# Patient Record
Sex: Male | Born: 1970 | Race: Black or African American | Hispanic: No | Marital: Married | Smoking: Never smoker
Health system: Southern US, Community
[De-identification: ages and names within clinical notes are randomized; demographics above are authoritative.]

## PROBLEM LIST (undated history)

## (undated) DIAGNOSIS — I10 Essential (primary) hypertension: Secondary | ICD-10-CM

## (undated) HISTORY — PX: NO PAST SURGERIES: SHX2092

---

## 2012-12-14 DIAGNOSIS — I10 Essential (primary) hypertension: Secondary | ICD-10-CM | POA: Insufficient documentation

## 2012-12-14 DIAGNOSIS — E785 Hyperlipidemia, unspecified: Secondary | ICD-10-CM | POA: Insufficient documentation

## 2013-12-05 DIAGNOSIS — Z6841 Body Mass Index (BMI) 40.0 and over, adult: Secondary | ICD-10-CM

## 2015-03-29 DIAGNOSIS — R7303 Prediabetes: Secondary | ICD-10-CM | POA: Insufficient documentation

## 2016-05-29 ENCOUNTER — Emergency Department
Admission: EM | Admit: 2016-05-29 | Discharge: 2016-05-29 | Disposition: A | Discharge status: Home / Self Care | Payer: BC Managed Care – PPO | Attending: Emergency Medicine | Admitting: Emergency Medicine

## 2016-05-29 ENCOUNTER — Encounter: Payer: Self-pay | Admitting: Urgent Care

## 2016-05-29 ENCOUNTER — Emergency Department: Payer: BC Managed Care – PPO

## 2016-05-29 ENCOUNTER — Emergency Department
Admission: EM | Admit: 2016-05-29 | Discharge: 2016-05-29 | Disposition: A | Discharge status: Home / Self Care | Payer: BC Managed Care – PPO | Source: Home / Self Care | Attending: Student | Admitting: Student

## 2016-05-29 ENCOUNTER — Encounter: Payer: Self-pay | Admitting: Emergency Medicine

## 2016-05-29 DIAGNOSIS — I10 Essential (primary) hypertension: Secondary | ICD-10-CM | POA: Insufficient documentation

## 2016-05-29 DIAGNOSIS — K8020 Calculus of gallbladder without cholecystitis without obstruction: Secondary | ICD-10-CM | POA: Diagnosis not present

## 2016-05-29 DIAGNOSIS — Z79899 Other long term (current) drug therapy: Secondary | ICD-10-CM | POA: Insufficient documentation

## 2016-05-29 DIAGNOSIS — K8050 Calculus of bile duct without cholangitis or cholecystitis without obstruction: Secondary | ICD-10-CM | POA: Insufficient documentation

## 2016-05-29 DIAGNOSIS — R109 Unspecified abdominal pain: Secondary | ICD-10-CM

## 2016-05-29 DIAGNOSIS — R1011 Right upper quadrant pain: Secondary | ICD-10-CM | POA: Diagnosis present

## 2016-05-29 HISTORY — DX: Essential (primary) hypertension: I10

## 2016-05-29 LAB — COMPREHENSIVE METABOLIC PANEL
ALBUMIN: 4.1 g/dL (ref 3.5–5.0)
ALT: 25 U/L (ref 17–63)
AST: 26 U/L (ref 15–41)
Alkaline Phosphatase: 49 U/L (ref 38–126)
Anion gap: 9 (ref 5–15)
BILIRUBIN TOTAL: 0.3 mg/dL (ref 0.3–1.2)
BUN: 17 mg/dL (ref 6–20)
CHLORIDE: 98 mmol/L — AB (ref 101–111)
CO2: 27 mmol/L (ref 22–32)
CREATININE: 1.09 mg/dL (ref 0.61–1.24)
Calcium: 9.1 mg/dL (ref 8.9–10.3)
GFR calc Af Amer: >60 mL/min (ref >60)
GLUCOSE: 115 mg/dL — AB (ref 65–99)
POTASSIUM: 3.4 mmol/L — AB (ref 3.5–5.1)
Sodium: 134 mmol/L — ABNORMAL LOW (ref 135–145)
TOTAL PROTEIN: 8.2 g/dL — AB (ref 6.5–8.1)

## 2016-05-29 LAB — CBC
HEMATOCRIT: 40.8 % (ref 40.0–52.0)
Hemoglobin: 13.7 g/dL (ref 13.0–18.0)
MCH: 26.3 pg (ref 26.0–34.0)
MCHC: 33.5 g/dL (ref 32.0–36.0)
MCV: 78.4 fL — AB (ref 80.0–100.0)
PLATELETS: 220 10*3/uL (ref 150–440)
RBC: 5.2 MIL/uL (ref 4.40–5.90)
RDW: 14.5 % (ref 11.5–14.5)
WBC: 5.2 10*3/uL (ref 3.8–10.6)

## 2016-05-29 LAB — LIPASE, BLOOD: Lipase: 22 U/L (ref 11–51)

## 2016-05-29 MED ORDER — OXYCODONE-ACETAMINOPHEN 5-325 MG PO TABS: 1.0000 | ORAL_TABLET | ORAL | 0 refills | Status: DC | PRN

## 2016-05-29 MED ORDER — ONDANSETRON 4 MG PO TBDP
4.0000 mg | ORAL_TABLET | Freq: Once | ORAL | Status: AC
  Administered 2016-05-29: 4 mg via ORAL
  Filled 2016-05-29: qty 1

## 2016-05-29 MED ORDER — ONDANSETRON HCL 4 MG/2ML IJ SOLN
4.0000 mg | Freq: Once | INTRAMUSCULAR | Status: AC
  Administered 2016-05-29: 4 mg via INTRAVENOUS
  Filled 2016-05-29: qty 2

## 2016-05-29 MED ORDER — OXYCODONE HCL 5 MG PO TABS: 5.0000 mg | ORAL_TABLET | Freq: Four times a day (QID) | ORAL | 0 refills | Status: DC | PRN

## 2016-05-29 MED ORDER — ONDANSETRON 4 MG PO TBDP: 4.0000 mg | ORAL_TABLET | Freq: Three times a day (TID) | ORAL | 0 refills | Status: DC | PRN

## 2016-05-29 MED ORDER — HYDROMORPHONE HCL 1 MG/ML IJ SOLN
1.0000 mg | Freq: Once | INTRAMUSCULAR | Status: AC
  Administered 2016-05-29: 1 mg via INTRAVENOUS
  Filled 2016-05-29: qty 1

## 2016-05-29 MED ORDER — HYDROMORPHONE HCL 1 MG/ML IJ SOLN
1.0000 mg | Freq: Once | INTRAMUSCULAR | Status: AC
  Administered 2016-05-29: 1 mg via INTRAMUSCULAR
  Filled 2016-05-29: qty 1

## 2016-05-29 MED ORDER — MORPHINE SULFATE (PF) 4 MG/ML IV SOLN
4.0000 mg | Freq: Once | INTRAVENOUS | Status: AC
  Administered 2016-05-29: 4 mg via INTRAVENOUS
  Filled 2016-05-29: qty 1

## 2016-05-29 MED ORDER — OXYCODONE-ACETAMINOPHEN 5-325 MG PO TABS
2.0000 | ORAL_TABLET | Freq: Once | ORAL | Status: AC
  Administered 2016-05-29: 2 via ORAL
  Filled 2016-05-29: qty 2

## 2016-05-29 NOTE — ED Triage Notes (Signed)
Pt seen and treated in ED last night. Pt dx with gallstones and states that the medication is not providing any relief.

## 2016-05-29 NOTE — ED Notes (Signed)
Pt discharged to home.  Family member driving.  Discharge instructions reviewed.  Verbalized understanding.  No questions or concerns at this time.  Teach back verified.  Pt in NAD.  No items left in ED.   

## 2016-05-29 NOTE — ED Provider Notes (Signed)
[facility] Emergency Department Provider Note   ____________________________________________   First MD Initiated Contact with Patient 05/29/16 1004     (approximate)  I have reviewed the triage vital signs and the nursing notes.   HISTORY  Chief Complaint Abdominal Pain    HPI Nathan DickerFreddy Burch is a 45 y.o. male with history of hypertension and gallstones who presents for evaluation of continued right upper quadrant abdominal pain since last night, gradual onset, constant, currently moderate to severe, mildly improved with Percocet. The patient was seen in this emergency department earlier this morning for similar pain, diagnosed with symptomatic cholelithiasis without cholecystitis by ultrasound. He reported his pain had improved and he was discharged at 7 AM however he took his Percocet and has had only minimal improvement of his symptoms so he returns for continued pain. He has not had any more vomiting. He has not had any fevers or chills. He denies any chest pain.   Past Medical History:  Diagnosis Date  . Hypertension     There are no active problems to display for this patient.   History reviewed. No pertinent surgical history.  Prior to Admission medications   Medication Sig Start Date End Date Taking? Authorizing Provider  Olmesartan-Amlodipine-HCTZ 40-10-25 MG TABS Take 1 tablet by mouth daily. 12/09/15   Historical Provider, MD  ondansetron (ZOFRAN ODT) 4 MG disintegrating tablet Take 1 tablet (4 mg total) by mouth every 8 (eight) hours as needed for nausea or vomiting. 05/29/16   Darci Currentandolph N Brown, MD  oxyCODONE-acetaminophen (ROXICET) 5-325 MG tablet Take 1 tablet by mouth every 4 (four) hours as needed for severe pain. 05/29/16   Darci Currentandolph N Brown, MD    Allergies Review of patient's allergies indicates no known allergies.  History reviewed. No pertinent family history.  Social History Social History  Substance Use Topics  . Smoking  status: Never Smoker  . Smokeless tobacco: Never Used  . Alcohol use No    Review of Systems Constitutional: No fever/chills Eyes: No visual changes. ENT: No sore throat. Cardiovascular: Denies chest pain. Respiratory: Denies shortness of breath. Gastrointestinal: + abdominal pain.  No nausea, no vomiting.  No diarrhea.  No constipation. Genitourinary: Negative for dysuria. Musculoskeletal: Negative for back pain. Skin: Negative for rash. Neurological: Negative for headaches, focal weakness or numbness.  10-point ROS otherwise negative.  ____________________________________________   PHYSICAL EXAM:  VITAL SIGNS: ED Triage Vitals  Enc Vitals Group     BP 05/29/16 1000 (!) 146/101     Pulse Rate 05/29/16 1000 88     Resp 05/29/16 1000 18     Temp 05/29/16 1000 97.7 F (36.5 C)     Temp Source 05/29/16 1000 Oral     SpO2 05/29/16 1000 98 %     Weight 05/29/16 1000 (!) 373 lb (169.2 kg)     Height 05/29/16 1000 5\' 11"  (1.803 m)     Head Circumference --      Peak Flow --      Pain Score 05/29/16 1001 10     Pain Loc --      Pain Edu? --      Excl. in GC? --     Constitutional: Alert and oriented. Well appearing and in no acute distress. Eyes: Conjunctivae are normal. PERRL. EOMI. Head: Atraumatic. Nose: No congestion/rhinnorhea. Mouth/Throat: Mucous membranes are moist.  Oropharynx non-erythematous. Neck: No stridor. Supple without meningismus. Cardiovascular: Normal rate, regular rhythm. Grossly normal heart sounds.  Good peripheral circulation. Respiratory:  Normal respiratory effort.  No retractions. Lungs CTAB. Gastrointestinal: Soft with tenderness to palpation in the epigastrium and the right upper quadrant. Obese body habitus limits the exam. No CVA tenderness. Genitourinary: Deferred Musculoskeletal: No lower extremity tenderness nor edema.  No joint effusions. Neurologic:  Normal speech and language. No gross focal neurologic deficits are appreciated. No  gait instability. Skin:  Skin is warm, dry and intact. No rash noted. Psychiatric: Mood and affect are normal. Speech and behavior are normal.  ____________________________________________   LABS (all labs ordered are listed, but only abnormal results are displayed)  Labs Reviewed - No data to display ____________________________________________  EKG  None ____________________________________________  RADIOLOGY  none ____________________________________________   PROCEDURES  Procedure(s) performed: None  Procedures  Critical Care performed: No  ____________________________________________   INITIAL IMPRESSION / ASSESSMENT AND PLAN / ED COURSE  Pertinent labs & imaging results that were available during my care of the patient were reviewed by me and considered in my medical decision making (see chart for details).  Nathan Burch is a 45 y.o. male with history of hypertension and gallstones who presents for evaluation of continued right upper quadrant abdominal pain since last night, in the setting of recently diagnosed symptomatic cholelithiasis. On exam, he is generally well-appearing and in no acute distress. His vital signs are stable and he is afebrile. He has mild epigastric and right upper quadrant tenderness to palpation. His labs were drawn at approximately 3:42 AM and they were unrevealing, in the absence of fever or vomiting, no indication for repeat lab work at this time. His ultrasound obtained this morning, no indication for repeat at this time. We'll treat him symptomatically and reassess for disposition.  ----------------------------------------- 11:50 AM on 05/29/2016 -----------------------------------------  Patient reports his pain is significantly improved at this time, he is tolerating by mouth intake without vomiting. Discussed discharge with return precautions, need for close general surgery follow-up and he is comfortable with the discharge plan. DC  home.  Clinical Course     ____________________________________________   FINAL CLINICAL IMPRESSION(S) / ED DIAGNOSES  Final diagnoses:  Abdominal pain, unspecified abdominal location  Biliary colic      NEW MEDICATIONS STARTED DURING THIS VISIT:  New Prescriptions   No medications on file     Note:  This document was prepared using Dragon voice recognition software and may include unintentional dictation errors.    Gayla Doss, MD 05/29/16 1151

## 2016-05-29 NOTE — ED Notes (Signed)
MD at bedside for update on discharge

## 2016-05-29 NOTE — ED Triage Notes (Signed)
Patient presents with c/o epigastric pain with (+) N/V that started around midnight. Patient has had issues in the past with his gallbladder; feels the same.

## 2016-05-29 NOTE — ED Provider Notes (Signed)
[facility] Emergency Department Provider Note   First MD Initiated Contact with Patient 05/29/16 [phone]     (approximate)  I have reviewed the triage vital signs and the nursing notes.   HISTORY  Chief Complaint Abdominal Pain; Nausea; and Emesis   HPI Nathan Burch is a 45 y.o. male with history of "gallbladder problems" presents to the emergency department with persistent right upper quadrant abdominal pain accompanied by vomiting with onset at midnight tonight. Patient states his current pain score is 10 out of 10. Patient denies any fever or diarrhea. Patient denies any urinary symptoms.   Past Medical History:  Diagnosis Date  . Hypertension     There are no active problems to display for this patient.   Past surgical history None  Prior to Admission medications   Medication Sig Start Date End Date Taking? Authorizing Provider  Olmesartan-Amlodipine-HCTZ 40-10-25 MG TABS Take 1 tablet by mouth daily. 12/09/15  Yes Historical Provider, MD    Allergies No known drug allergies No family history on file.  Social History Social History  Substance Use Topics  . Smoking status: Never Smoker  . Smokeless tobacco: Never Used  . Alcohol use No    Review of Systems Constitutional: No fever/chills Eyes: No visual changes. ENT: No sore throat. Cardiovascular: Denies chest pain. Respiratory: Denies shortness of breath. Gastrointestinal: Positive for abdominal pain and vomiting  No diarrhea.  No constipation. Genitourinary: Negative for dysuria. Musculoskeletal: Negative for back pain. Skin: Negative for rash. Neurological: Negative for headaches, focal weakness or numbness.  10-point ROS otherwise negative.  ____________________________________________   PHYSICAL EXAM:  VITAL SIGNS: ED Triage Vitals  Enc Vitals Group     BP 05/29/16 0308 139/84     Pulse Rate 05/29/16 0308 99     Resp 05/29/16 0308 20     Temp 05/29/16 0308 98.1 F  (36.7 C)     Temp Source 05/29/16 0308 Oral     SpO2 05/29/16 0308 95 %     Weight 05/29/16 0309 (!) 373 lb (169.2 kg)     Height 05/29/16 0309 5\' 11"  (1.803 m)     Head Circumference --      Peak Flow --      Pain Score 05/29/16 0317 7     Pain Loc --      Pain Edu? --      Excl. in GC? --     Constitutional: Alert and oriented. Apparent discomfort Eyes: Conjunctivae are normal. PERRL. EOMI. Head: Atraumatic. Mouth/Throat: Mucous membranes are moist.  Oropharynx non-erythematous. Neck: No stridor.  No meningeal signs.   Cardiovascular: Normal rate, regular rhythm. Good peripheral circulation. Grossly normal heart sounds. Respiratory: Normal respiratory effort.  No retractions. Lungs CTAB. Gastrointestinal: Right upper quadrant tenderness to palpation. No distention.  Musculoskeletal: No lower extremity tenderness nor edema. No gross deformities of extremities. Neurologic:  Normal speech and language. No gross focal neurologic deficits are appreciated.  Skin:  Skin is warm, dry and intact. No rash noted. Psychiatric: Mood and affect are normal. Speech and behavior are normal.  ____________________________________________   LABS (all labs ordered are listed, but only abnormal results are displayed)  Labs Reviewed  COMPREHENSIVE METABOLIC PANEL - Abnormal; Notable for the following:       Result Value   Sodium 134 (*)    Potassium 3.4 (*)    Chloride 98 (*)    Glucose, Bld 115 (*)    Total Protein 8.2 (*)  All other components within normal limits  CBC - Abnormal; Notable for the following:    MCV 78.4 (*)    All other components within normal limits  LIPASE, BLOOD  URINALYSIS COMPLETEWITH MICROSCOPIC ([facility] ONLY)   ____________________________________  RADIOLOGY I, [place] Dewayne Shorter, personally viewed and evaluated these images (plain radiographs) as part of my medical decision making, as well as reviewing the written report by the radiologist.*  US Abdomen Limited  Ruq  Result Date: 05/29/2016 CLINICAL DATA:  Right upper quadrant epigastric pain with nausea and vomiting for 4 hours. EXAM: US ABDOMEN LIMITED - RIGHT UPPER QUADRANT COMPARISON:  None. FINDINGS: Gallbladder: Cholelithiasis with several stones in the gallbladder including a nonmobile stone in the gallbladder neck. Largest stone is in the fundus and measures 2.5 cm diameter. No gallbladder wall thickening or edema. Murphy's sign is negative. Common bile duct: Diameter: 3 mm, normal Liver: Limited visualization due to body habitus and rib shadows. Appears to be diffuse increase in the liver echotexture consistent with fatty infiltration. No focal lesions identified. IMPRESSION: Cholelithiasis with a stone in the gallbladder neck. No additional features to suggest cholecystitis. Diffuse fatty infiltration of the liver. Electronically Signed   By: Burman Nieves M.D.   On: 05/29/2016 04:40      Procedures      INITIAL IMPRESSION / ASSESSMENT AND PLAN / ED COURSE  Pertinent labs & imaging results that were available during my care of the patient were reviewed by me and considered in my medical decision making (see chart for details).  Patient discussed with Dr. Evette Cristal general surgeon on call who recommended outpatient follow-up with pain control was achieved. Patient reevaluated pain is improved and a such patient will be discharged home with referral to Dr.   Clinical Course    ____________________________________________  FINAL CLINICAL IMPRESSION(S) / ED DIAGNOSES  Final diagnoses:  RUQ pain  Gallstones     MEDICATIONS GIVEN DURING THIS VISIT:  Medications  morphine 4 MG/ML injection 4 mg (4 mg Intravenous Given 05/29/16 0342)  ondansetron (ZOFRAN) injection 4 mg (4 mg Intravenous Given 05/29/16 0342)  HYDROmorphone (DILAUDID) injection 1 mg (1 mg Intravenous Given 05/29/16 0439)     NEW OUTPATIENT MEDICATIONS STARTED DURING THIS VISIT:  New Prescriptions   No medications  on file    Modified Medications   No medications on file    Discontinued Medications   No medications on file     Note:  This document was prepared using Dragon voice recognition software and may include unintentional dictation errors.    Darci Current, MD 05/29/16 [phone]

## 2016-05-30 ENCOUNTER — Other Ambulatory Visit: Payer: Self-pay

## 2016-05-31 ENCOUNTER — Ambulatory Visit (INDEPENDENT_AMBULATORY_CARE_PROVIDER_SITE_OTHER): Payer: BC Managed Care – PPO | Admitting: General Surgery

## 2016-05-31 ENCOUNTER — Encounter: Payer: Self-pay | Admitting: General Surgery

## 2016-05-31 VITALS — BP 125/81 | HR 88 | Temp 101.6°F | Ht 71.0 in | Wt 370.0 lb

## 2016-05-31 DIAGNOSIS — K8050 Calculus of bile duct without cholangitis or cholecystitis without obstruction: Secondary | ICD-10-CM

## 2016-05-31 NOTE — Progress Notes (Signed)
Patient ID: Nathan DickerFreddy Buechler, male   DOB: November 09, 1970, 45 y.o.   MRN: [id number]  CC: ABDOMINAL PAIN  HPI Nathan Burch is a 45 y.o. male presents to clinic for evaluation of abdominal pain. Patient reports using the emergency department 2 days ago for this abdominal pain. He had similar abdominal pain 5 years ago but states that pain was much worse than this attack. While he was being evaluated in the emergency department the pain resolved with pain medications. He has not had severe pain since leaving the emergency department. He has not been checking his temperature at home but he has had subjective chills. He denies any fevers, chest pain, shortness of breath, nausea, vomiting, diarrhea, constipation. He has been on a new diet which has changed his intake patterns within the last week he is otherwise in his usual state of health.  HPI  Past Medical History:  Diagnosis Date  . Hypertension     Past Surgical History:  Procedure Laterality Date  . NO PAST SURGERIES      Family History  Problem Relation Age of Onset  . Heart disease Mother   . Diabetes Mother   . Hypertension Mother   . Heart disease Father   . Hypertension Father     Social History Social History  Substance Use Topics  . Smoking status: Never Smoker  . Smokeless tobacco: Never Used  . Alcohol use No    No Known Allergies  Current Outpatient Prescriptions  Medication Sig Dispense Refill  . atorvastatin (LIPITOR) 20 MG tablet Take by mouth.    . Olmesartan-Amlodipine-HCTZ 40-10-25 MG TABS Take 1 tablet by mouth daily.    . ondansetron (ZOFRAN ODT) 4 MG disintegrating tablet Take 1 tablet (4 mg total) by mouth every 8 (eight) hours as needed for nausea or vomiting. 12 tablet 0  . oxyCODONE (ROXICODONE) 5 MG immediate release tablet Take 1 tablet (5 mg total) by mouth every 6 (six) hours as needed for moderate pain (Take 1-2 tabs by mouth every 6 hours when necessary for pain). Do not drive while taking this  medication. 15 tablet 0   No current facility-administered medications for this visit.      Review of Systems A Multi-point review of systems was asked and was negative except for the findings documented in the history of present illness  Physical Exam Blood pressure 125/81, pulse 88, temperature (!) 101.6 F (38.7 C), temperature source Oral, height 5\' 11"  (1.803 m), weight (!) 167.8 kg (370 lb). CONSTITUTIONAL: No acute distress. EYES: Pupils are equal, round, and reactive to light, Sclera are non-icteric. EARS, NOSE, MOUTH AND THROAT: The oropharynx is clear. The oral mucosa is pink and moist. Hearing is intact to voice. LYMPH NODES:  Lymph nodes in the neck are normal. RESPIRATORY:  Lungs are clear. There is normal respiratory effort, with equal breath sounds bilaterally, and without pathologic use of accessory muscles. CARDIOVASCULAR: Heart is regular without murmurs, gallops, or rubs. GI: The abdomen is very large, soft, nontender, and nondistended. There are no palpable masses. There is no hepatosplenomegaly. There are normal bowel sounds in all quadrants. GU: Rectal deferred.   MUSCULOSKELETAL: Normal muscle strength and tone. No cyanosis or edema.   SKIN: Turgor is good and there are no pathologic skin lesions or ulcers. NEUROLOGIC: Motor and sensation is grossly normal. Cranial nerves are grossly intact. PSYCH:  Oriented to person, place and time. Affect is normal.  Data Reviewed Images and labs reviewed from the ER. Labs are all  within normal limits without an elevated white blood cell count. Ultrasound reviewed which shows evidence of cholelithiasis but no gallbladder wall thickening, pericholecystic fluid, common duct dilatation I have personally reviewed the patient's imaging, laboratory findings and medical records.    Assessment    Biliary colic    Plan    45 year old male with biliary colic. Discussed at length the signs and symptoms of cholecystitis. Provided him  with the treatment options would include surgery was a laparoscopic cholecystectomy. The procedure itself was described in detail. A long discussion about the disease process and treatment options patient voiced he was unsure whether not he wants surgery. He would like to take some time to think about it and he will contact clinic should he decide to proceed with surgical intervention. Discussed again the signs of cholecystitis and for him to return immediately to either clinic or the emergency department should they occur. He voiced understanding and agreement.     Time spent with the patient was 45 minutes, with more than 50% of the time spent in face-to-face education, counseling and care coordination.     Ricarda Frame, MD FACS General Surgeon 05/31/2016, 3:32 PM

## 2016-05-31 NOTE — Patient Instructions (Signed)
Please call our office with any questions or concerns.  Low-Fat Diet for Pancreatitis or Gallbladder Conditions A low-fat diet can be helpful if you have pancreatitis or a gallbladder condition. With these conditions, your pancreas and gallbladder have trouble digesting fats. A healthy eating plan with less fat will help rest your pancreas and gallbladder and reduce your symptoms. WHAT DO I NEED TO KNOW ABOUT THIS DIET?  Eat a low-fat diet.  Reduce your fat intake to less than 20-30% of your total daily calories. This is less than 50-60 g of fat per day.  Remember that you need some fat in your diet. Ask your dietician what your daily goal should be.  Choose nonfat and low-fat healthy foods. Look for the words "nonfat," "low fat," or "fat free."  As a guide, look on the label and choose foods with less than 3 g of fat per serving. Eat only one serving.  Avoid alcohol.  Do not smoke. If you need help quitting, talk with your health care provider.  Eat small frequent meals instead of three large heavy meals. WHAT FOODS CAN I EAT? Grains Include healthy grains and starches such as potatoes, wheat bread, fiber-rich cereal, and brown rice. Choose whole grain options whenever possible. In adults, whole grains should account for 45-65% of your daily calories.  Fruits and Vegetables Eat plenty of fruits and vegetables. Fresh fruits and vegetables add fiber to your diet. Meats and Other Protein Sources Eat lean meat such as chicken and pork. Trim any fat off of meat before cooking it. Eggs, fish, and beans are other sources of protein. In adults, these foods should account for 10-35% of your daily calories. Dairy Choose low-fat milk and dairy options. Dairy includes fat and protein, as well as calcium.  Fats and Oils Limit high-fat foods such as fried foods, sweets, baked goods, sugary drinks.  Other Creamy sauces and condiments, such as mayonnaise, can add extra fat. Think about whether or  not you need to use them, or use smaller amounts or low fat options. WHAT FOODS ARE NOT RECOMMENDED?  High fat foods, such as:  [employer]aked goods.  Ice cream.  [place] toast.  Sweet rolls.  Pizza.  Cheese bread.  Foods covered with batter, butter, creamy sauces, or cheese.  Fried foods.  Sugary drinks and desserts.  Foods that cause gas or bloating   This information is not intended to replace advice given to you by your health care provider. Make sure you discuss any questions you have with your health care provider.   Document Released: 08/27/2013 Document Reviewed: 08/27/2013 Elsevier Interactive Patient Education [employer]2016 Elsevier Inc.

## 2016-06-01 ENCOUNTER — Telehealth: Payer: Self-pay | Admitting: General Surgery

## 2016-06-01 ENCOUNTER — Observation Stay
Admission: EM | Admit: 2016-06-01 | Discharge: 2016-06-05 | Disposition: A | Discharge status: Home / Self Care | Payer: BC Managed Care – PPO | Attending: Surgery | Admitting: Surgery

## 2016-06-01 ENCOUNTER — Emergency Department: Payer: BC Managed Care – PPO

## 2016-06-01 ENCOUNTER — Encounter: Payer: Self-pay | Admitting: Emergency Medicine

## 2016-06-01 DIAGNOSIS — Z6841 Body Mass Index (BMI) 40.0 and over, adult: Secondary | ICD-10-CM | POA: Diagnosis not present

## 2016-06-01 DIAGNOSIS — I10 Essential (primary) hypertension: Secondary | ICD-10-CM | POA: Diagnosis not present

## 2016-06-01 DIAGNOSIS — K810 Acute cholecystitis: Secondary | ICD-10-CM | POA: Diagnosis not present

## 2016-06-01 DIAGNOSIS — K8000 Calculus of gallbladder with acute cholecystitis without obstruction: Secondary | ICD-10-CM | POA: Diagnosis not present

## 2016-06-01 DIAGNOSIS — R52 Pain, unspecified: Secondary | ICD-10-CM

## 2016-06-01 DIAGNOSIS — E785 Hyperlipidemia, unspecified: Secondary | ICD-10-CM | POA: Diagnosis not present

## 2016-06-01 DIAGNOSIS — Z79899 Other long term (current) drug therapy: Secondary | ICD-10-CM | POA: Insufficient documentation

## 2016-06-01 DIAGNOSIS — R109 Unspecified abdominal pain: Secondary | ICD-10-CM | POA: Diagnosis present

## 2016-06-01 LAB — COMPREHENSIVE METABOLIC PANEL
ALT: 30 U/L (ref 17–63)
ANION GAP: 10 (ref 5–15)
AST: 24 U/L (ref 15–41)
Albumin: 3.6 g/dL (ref 3.5–5.0)
Alkaline Phosphatase: 50 U/L (ref 38–126)
BILIRUBIN TOTAL: 0.9 mg/dL (ref 0.3–1.2)
BUN: 12 mg/dL (ref 6–20)
CHLORIDE: 97 mmol/L — AB (ref 101–111)
CO2: 27 mmol/L (ref 22–32)
Calcium: 9.1 mg/dL (ref 8.9–10.3)
Creatinine, Ser: 1.06 mg/dL (ref 0.61–1.24)
Glucose, Bld: 100 mg/dL — ABNORMAL HIGH (ref 65–99)
POTASSIUM: 3.5 mmol/L (ref 3.5–5.1)
Sodium: 134 mmol/L — ABNORMAL LOW (ref 135–145)
TOTAL PROTEIN: 7.5 g/dL (ref 6.5–8.1)

## 2016-06-01 LAB — URINALYSIS COMPLETE WITH MICROSCOPIC (ARMC ONLY)
BACTERIA UA: NONE SEEN
BILIRUBIN URINE: NEGATIVE
Glucose, UA: NEGATIVE mg/dL
Hgb urine dipstick: NEGATIVE
KETONES UR: NEGATIVE mg/dL
LEUKOCYTES UA: NEGATIVE
NITRITE: NEGATIVE
PH: 7 (ref 5.0–8.0)
Protein, ur: NEGATIVE mg/dL
Specific Gravity, Urine: 1.013 (ref 1.005–1.030)

## 2016-06-01 LAB — CBC
HEMATOCRIT: 39.3 % — AB (ref 40.0–52.0)
HEMOGLOBIN: 13.3 g/dL (ref 13.0–18.0)
MCH: 26.5 pg (ref 26.0–34.0)
MCHC: 33.9 g/dL (ref 32.0–36.0)
MCV: 78 fL — AB (ref 80.0–100.0)
Platelets: 229 10*3/uL (ref 150–440)
RBC: 5.05 MIL/uL (ref 4.40–5.90)
RDW: 14.7 % — AB (ref 11.5–14.5)
WBC: 7.7 10*3/uL (ref 3.8–10.6)

## 2016-06-01 LAB — LIPASE, BLOOD: LIPASE: 22 U/L (ref 11–51)

## 2016-06-01 MED ORDER — AMLODIPINE BESYLATE 10 MG PO TABS
10.0000 mg | ORAL_TABLET | Freq: Every day | ORAL | Status: DC
  Administered 2016-06-02: 10 mg via ORAL
  Filled 2016-06-01: qty 1

## 2016-06-01 MED ORDER — SODIUM CHLORIDE 0.9 % IV SOLN
3.0000 g | Freq: Four times a day (QID) | INTRAVENOUS | Status: DC
  Administered 2016-06-01 – 2016-06-05 (×14): 3 g via INTRAVENOUS
  Filled 2016-06-01 (×18): qty 3

## 2016-06-01 MED ORDER — PIPERACILLIN-TAZOBACTAM 3.375 G IVPB 30 MIN
3.3750 g | Freq: Once | INTRAVENOUS | Status: AC
  Administered 2016-06-01: 3.375 g via INTRAVENOUS
  Filled 2016-06-01: qty 50

## 2016-06-01 MED ORDER — OXYCODONE HCL 5 MG PO TABS
5.0000 mg | ORAL_TABLET | Freq: Four times a day (QID) | ORAL | Status: DC | PRN
  Administered 2016-06-01: 10 mg via ORAL
  Administered 2016-06-02: 5 mg via ORAL
  Filled 2016-06-01 (×3): qty 1

## 2016-06-01 MED ORDER — ONDANSETRON HCL 4 MG PO TABS: 4.0000 mg | ORAL_TABLET | Freq: Four times a day (QID) | ORAL | Status: DC | PRN

## 2016-06-01 MED ORDER — SODIUM CHLORIDE 0.9 % IV SOLN
INTRAVENOUS | Status: AC
  Administered 2016-06-01: 3 g via INTRAVENOUS
  Filled 2016-06-01: qty 3

## 2016-06-01 MED ORDER — ONDANSETRON HCL 4 MG/2ML IJ SOLN
4.0000 mg | Freq: Four times a day (QID) | INTRAMUSCULAR | Status: DC | PRN
Start: 2016-06-01 — End: 2016-06-05

## 2016-06-01 MED ORDER — IRBESARTAN 300 MG PO TABS
300.0000 mg | ORAL_TABLET | Freq: Every day | ORAL | Status: DC
  Administered 2016-06-02 – 2016-06-05 (×4): 300 mg via ORAL
  Filled 2016-06-01 (×4): qty 1

## 2016-06-01 MED ORDER — OLMESARTAN-AMLODIPINE-HCTZ 40-10-25 MG PO TABS: 1.0000 | ORAL_TABLET | ORAL | Status: DC

## 2016-06-01 MED ORDER — HYDROCHLOROTHIAZIDE 25 MG PO TABS
25.0000 mg | ORAL_TABLET | Freq: Every day | ORAL | Status: DC
  Administered 2016-06-02 – 2016-06-05 (×4): 25 mg via ORAL
  Filled 2016-06-01 (×4): qty 1

## 2016-06-01 MED ORDER — MORPHINE SULFATE (PF) 4 MG/ML IV SOLN
4.0000 mg | INTRAVENOUS | Status: DC | PRN
  Administered 2016-06-01 – 2016-06-03 (×3): 4 mg via INTRAVENOUS
  Filled 2016-06-01 (×3): qty 1

## 2016-06-01 MED ORDER — LACTATED RINGERS IV SOLN
INTRAVENOUS | Status: DC
  Administered 2016-06-01 – 2016-06-03 (×4): via INTRAVENOUS

## 2016-06-01 MED ORDER — ONDANSETRON HCL 4 MG/2ML IJ SOLN
4.0000 mg | Freq: Once | INTRAMUSCULAR | Status: AC
  Administered 2016-06-01: 4 mg via INTRAVENOUS
  Filled 2016-06-01: qty 2

## 2016-06-01 MED ORDER — HEPARIN SODIUM (PORCINE) 5000 UNIT/ML IJ SOLN
5000.0000 [IU] | Freq: Three times a day (TID) | INTRAMUSCULAR | Status: DC
  Administered 2016-06-01 – 2016-06-02 (×2): 5000 [IU] via SUBCUTANEOUS
  Filled 2016-06-01 (×2): qty 1

## 2016-06-01 NOTE — ED Triage Notes (Signed)
Pt started with RUQ pain Sunday. Dx with gallstones and saw surgery yesterday and will get scheduled for surgery but today pain is worse and dr Tonita Congwoodham office told pt to come to ED. Fever yesterday 101 at surgery office.

## 2016-06-01 NOTE — Telephone Encounter (Signed)
Patient was in yesterday. He is almost out of Oxycoden 5mg  and needs a refill. Ondansetronodt 4 mg for nausea, he may need more of that. He stated that Dr. Tonita CongWoodham told him to think about surgery and call when ready. [facility] [address]

## 2016-06-01 NOTE — ED Notes (Signed)
Patient updated on wait time 

## 2016-06-01 NOTE — ED Notes (Signed)
Patient transported to Ultrasound 

## 2016-06-01 NOTE — ED Provider Notes (Signed)
2  [facility] Emergency Department Provider Note    First MD Initiated Contact with Patient 06/01/16 1620     (approximate)  I have reviewed the triage vital signs and the nursing notes.   HISTORY  Chief Complaint Abdominal Pain    HPI Nathan Burch is a 45 y.o. male who recently diagnosed cholelithiasis presents with persistent and worsening right upper quadrant pain associated with nausea. She currently states it is 5 out of 10 in severity but states that during episodes will go up to 10 out of 10. Patient went to see general surgery yesterday with plan for outpatient cholecystectomy. Patient was noted to be febrile in the office. States he has been having ongoing chills. He was having worsening pain today and called the clinic who directed him to the ER for further evaluation. States that the pain is not changed in character. Denies shortness of breath or cough. No congestion.   Past Medical History:  Diagnosis Date  . Hypertension     Patient Active Problem List   Diagnosis Date Noted  . Prediabetes 03/29/2015  . Morbid obesity with BMI of 50.0-59.9, adult (HCC) 12/05/2013  . Idiopathic hypertension 12/14/2012  . Hyperlipidemia 12/14/2012    Past Surgical History:  Procedure Laterality Date  . NO PAST SURGERIES      Prior to Admission medications   Medication Sig Start Date End Date Taking? Authorizing Provider  atorvastatin (LIPITOR) 20 MG tablet Take by mouth. 11/11/15   Historical Provider, MD  Olmesartan-Amlodipine-HCTZ 40-10-25 MG TABS Take 1 tablet by mouth daily. 12/09/15   Historical Provider, MD  ondansetron (ZOFRAN ODT) 4 MG disintegrating tablet Take 1 tablet (4 mg total) by mouth every 8 (eight) hours as needed for nausea or vomiting. 05/29/16   Gayla DossEryka A Gayle, MD  oxyCODONE (ROXICODONE) 5 MG immediate release tablet Take 1 tablet (5 mg total) by mouth every 6 (six) hours as needed for moderate pain (Take 1-2 tabs by mouth every 6 hours  when necessary for pain). Do not drive while taking this medication. 05/29/16   Gayla DossEryka A Gayle, MD    Allergies Review of patient's allergies indicates no known allergies.  Family History  Problem Relation Age of Onset  . Heart disease Mother   . Diabetes Mother   . Hypertension Mother   . Heart disease Father   . Hypertension Father     Social History Social History  Substance Use Topics  . Smoking status: Never Smoker  . Smokeless tobacco: Never Used  . Alcohol use No    Review of Systems Patient denies headaches, rhinorrhea, blurry vision, numbness, shortness of breath, chest pain, edema, cough, abdominal pain, nausea, vomiting, diarrhea, dysuria, fevers, rashes or hallucinations unless otherwise stated above in HPI. ____________________________________________   PHYSICAL EXAM:  VITAL SIGNS: Vitals:   06/01/16 1302  BP: 139/90  Pulse: 93  Resp: 18  Temp: 99 F (37.2 C)    Constitutional: Alert and oriented. Well appearing and in no acute distress. Eyes: Conjunctivae are normal. PERRL. EOMI. Head: Atraumatic. Nose: No congestion/rhinnorhea. Mouth/Throat: Mucous membranes are moist.  Oropharynx non-erythematous. Neck: No stridor. Painless ROM. No cervical spine tenderness to palpation Hematological/Lymphatic/Immunilogical: No cervical lymphadenopathy. Cardiovascular: Normal rate, regular rhythm. Grossly normal heart sounds.  Good peripheral circulation. Respiratory: Normal respiratory effort.  No retractions. Lungs CTAB. Gastrointestinal: Soft and RUQ ttp. No distention. No abdominal bruits. No CVA tenderness. Musculoskeletal: No lower extremity tenderness nor edema.  No joint effusions. Neurologic:  Normal speech and  language. No gross focal neurologic deficits are appreciated. No gait instability. Skin:  Skin is warm, dry and intact. No rash noted. Psychiatric: Mood and affect are normal. Speech and behavior are  normal.  ____________________________________________   LABS (all labs ordered are listed, but only abnormal results are displayed)  Results for orders placed or performed during the hospital encounter of 06/01/16 (from the past 24 hour(s))  Lipase, blood     Status: None   Collection Time: 06/01/16  1:04 PM  Result Value Ref Range   Lipase 22 11 - 51 U/L  Comprehensive metabolic panel     Status: Abnormal   Collection Time: 06/01/16  1:04 PM  Result Value Ref Range   Sodium 134 (L) 135 - 145 mmol/L   Potassium 3.5 3.5 - 5.1 mmol/L   Chloride 97 (L) 101 - 111 mmol/L   CO2 27 22 - 32 mmol/L   Glucose, Bld 100 (H) 65 - 99 mg/dL   BUN 12 6 - 20 mg/dL   Creatinine, Ser 1.61 0.61 - 1.24 mg/dL   Calcium 9.1 8.9 - 09.6 mg/dL   Total Protein 7.5 6.5 - 8.1 g/dL   Albumin 3.6 3.5 - 5.0 g/dL   AST 24 15 - 41 U/L   ALT 30 17 - 63 U/L   Alkaline Phosphatase 50 38 - 126 U/L   Total Bilirubin 0.9 0.3 - 1.2 mg/dL   GFR calc non Af Amer >60 >60 mL/min   GFR calc Af Amer >60 >60 mL/min   Anion gap 10 5 - 15  CBC     Status: Abnormal   Collection Time: 06/01/16  1:04 PM  Result Value Ref Range   WBC 7.7 3.8 - 10.6 K/uL   RBC 5.05 4.40 - 5.90 MIL/uL   Hemoglobin 13.3 13.0 - 18.0 g/dL   HCT 04.5 (L) 40.9 - 81.1 %   MCV 78.0 (L) 80.0 - 100.0 fL   MCH 26.5 26.0 - 34.0 pg   MCHC 33.9 32.0 - 36.0 g/dL   RDW 91.4 (H) 78.2 - 95.6 %   Platelets 229 150 - 440 K/uL  Urinalysis complete, with microscopic     Status: Abnormal   Collection Time: 06/01/16  1:05 PM  Result Value Ref Range   Color, Urine YELLOW (A) YELLOW   APPearance CLEAR (A) CLEAR   Glucose, UA NEGATIVE NEGATIVE mg/dL   Bilirubin Urine NEGATIVE NEGATIVE   Ketones, ur NEGATIVE NEGATIVE mg/dL   Specific Gravity, Urine 1.013 1.005 - 1.030   Hgb urine dipstick NEGATIVE NEGATIVE   pH 7.0 5.0 - 8.0   Protein, ur NEGATIVE NEGATIVE mg/dL   Nitrite NEGATIVE NEGATIVE   Leukocytes, UA NEGATIVE NEGATIVE   RBC / HPF 0-5 0 - 5 RBC/hpf    WBC, UA 0-5 0 - 5 WBC/hpf   Bacteria, UA NONE SEEN NONE SEEN   Squamous Epithelial / LPF 0-5 (A) NONE SEEN   Mucous PRESENT    ____________________________________________   ____________________________________________  RADIOLOGY  See chart ____________________________________________   PROCEDURES  Procedure(s) performed: none    Critical Care performed: no ____________________________________________   INITIAL IMPRESSION / ASSESSMENT AND PLAN / ED COURSE  Pertinent labs & imaging results that were available during my care of the patient were reviewed by me and considered in my medical decision making (see chart for details).  DDX: cholelithiasis, cholecystitis, diliary colic, hepatitis, gastritis  Nathan Burch is a 45 y.o. who presents to the ED with symptomatically lithiasis presenting with fever and  worsening pain. Laboratory evaluation ordered to evaluate for above complaints shows no acute leukocytosis or evidence of significant biliary obstruction his abdominal exam is tender patient does have a low-grade fever. We'll repeat right upper quadrant ultrasound and touch base with general surgery.  The patient will be placed on continuous pulse oximetry and telemetry for monitoring.  Laboratory evaluation will be sent to evaluate for the above complaints.     Clinical Course  Comment By Time  Spoke with Dr. Everlene Farrier of Gen. surgery regarding the patient's presentation. We'll repeat ultrasound. Patient likely to require admission for IV antibiotics and cholecystectomy tomorrow morning. Willy Eddy, MD 09/27 1651     ____________________________________________   FINAL CLINICAL IMPRESSION(S) / ED DIAGNOSES  Final diagnoses:  Pain  Calculus of gallbladder with acute cholecystitis without obstruction      NEW MEDICATIONS STARTED DURING THIS VISIT:  New Prescriptions   No medications on file     Note:  This document was prepared using Dragon voice  recognition software and may include unintentional dictation errors.    Willy Eddy, MD 06/02/16 [phone]

## 2016-06-01 NOTE — Telephone Encounter (Signed)
Returned phone call to patient at this time. Patient is still having nausea and severe upper abdominal pain. He is still experiencing chills at this time.  Spoke with Dr. Tonita CongWoodham whom saw the patient in office yesterday who has ordered for patient to go to the ED at this time.   Dr. Everlene FarrierPabon was notified of this patient via OR nurse at this time.

## 2016-06-01 NOTE — H&P (Signed)
Nathan Burch is an 45 y.o. male.    Chief Complaint: Right upper quadrant pain  HPI: This a patient with recurrent and episodic right upper quadrant pain associated fatty food intolerance and workup in the past and shown gallstones. He saw Dr. Adonis Huguenin in the office and was to schedule surgery but has not yet done that. He wanted to have new pain medication ordered today but so he came to the emergency room but his pain was worse and was worked up in the ER and it showed worsening of his ultrasound suggestive of acute cholecystitis. He points to the right upper quadrant states his pain is about the same is been going on for some time it is episodic he's had some nausea but no emesis no fevers or chills no jaundice or acholic stools.  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 Hypertension     Past Surgical History:  Procedure Laterality Date  . NO PAST SURGERIES      Family History  Problem Relation Age of Onset  . Heart disease Mother   . Diabetes Mother   . Hypertension Mother   . Heart disease Father   . Hypertension Father    Social History:  reports that he has never smoked. He has never used smokeless tobacco. He reports that he does not drink alcohol or use drugs.  Allergies: No Known Allergies   (Not in a hospital admission)   Review of Systems  Constitutional: Negative for chills and fever.  HENT: Negative.   Eyes: Negative.   Respiratory: Negative.   Cardiovascular: Negative.   Gastrointestinal: Positive for abdominal pain and constipation. Negative for blood in stool, diarrhea, heartburn, melena, nausea and vomiting.  Genitourinary: Negative.   Musculoskeletal: Negative.   Skin: Negative.   Neurological: Negative.   Endo/Heme/Allergies: Negative.   Psychiatric/Behavioral: Negative.      Physical Exam:  BP 139/90   Pulse 93   Temp 99 F (37.2 C) (Oral)   Resp 18   Ht 5'  11" (1.803 m)   Wt (!) 370 lb (167.8 kg)   SpO2 97%   BMI 51.60 kg/m   Physical Exam  Constitutional: He is oriented to person, place, and time and well-developed, well-nourished, and in no distress. No distress.  Obese patient, comfortable  HENT:  Head: Normocephalic and atraumatic.  Eyes: Right eye exhibits no discharge. Left eye exhibits no discharge. No scleral icterus.  Neck: Normal range of motion.  Cardiovascular: Normal rate, regular rhythm and normal heart sounds.   Pulmonary/Chest: Effort normal and breath sounds normal. No respiratory distress. He has no wheezes. He has no rales.  Abdominal: Soft. He exhibits no distension. There is tenderness. There is no rebound and no guarding.  Tender in the right upper quadrant with a questionable Murphy sign  Musculoskeletal: Normal range of motion. He exhibits no edema or tenderness.  Lymphadenopathy:    He has no cervical adenopathy.  Neurological: He is alert and oriented to person, place, and time.  Skin: Skin is warm and dry. No rash noted. He is not diaphoretic. No erythema.  Psychiatric: Mood and affect normal.  Vitals reviewed.       Results for orders placed or performed during the hospital encounter of 06/01/16 (from the past 48 hour(s))  Lipase, blood     Status: None   Collection Time: 06/01/16  1:04 PM  Result Value Ref Range   Lipase  22 11 - 51 U/L  Comprehensive metabolic panel     Status: Abnormal   Collection Time: 06/01/16  1:04 PM  Result Value Ref Range   Sodium 134 (L) 135 - 145 mmol/L   Potassium 3.5 3.5 - 5.1 mmol/L   Chloride 97 (L) 101 - 111 mmol/L   CO2 27 22 - 32 mmol/L   Glucose, Bld 100 (H) 65 - 99 mg/dL   BUN 12 6 - 20 mg/dL   Creatinine, Ser 1.06 0.61 - 1.24 mg/dL   Calcium 9.1 8.9 - 10.3 mg/dL   Total Protein 7.5 6.5 - 8.1 g/dL   Albumin 3.6 3.5 - 5.0 g/dL   AST 24 15 - 41 U/L   ALT 30 17 - 63 U/L   Alkaline Phosphatase 50 38 - 126 U/L   Total Bilirubin 0.9 0.3 - 1.2 mg/dL   GFR calc  non Af Amer >60 >60 mL/min   GFR calc Af Amer >60 >60 mL/min    Comment: (NOTE) The eGFR has been calculated using the CKD EPI equation. This calculation has not been validated in all clinical situations. eGFR's persistently <60 mL/min signify possible Chronic Kidney Disease.    Anion gap 10 5 - 15  CBC     Status: Abnormal   Collection Time: 06/01/16  1:04 PM  Result Value Ref Range   WBC 7.7 3.8 - 10.6 K/uL   RBC 5.05 4.40 - 5.90 MIL/uL   Hemoglobin 13.3 13.0 - 18.0 g/dL   HCT 39.3 (L) 40.0 - 52.0 %   MCV 78.0 (L) 80.0 - 100.0 fL   MCH 26.5 26.0 - 34.0 pg   MCHC 33.9 32.0 - 36.0 g/dL   RDW 14.7 (H) 11.5 - 14.5 %   Platelets 229 150 - 440 K/uL  Urinalysis complete, with microscopic     Status: Abnormal   Collection Time: 06/01/16  1:05 PM  Result Value Ref Range   Color, Urine YELLOW (A) YELLOW   APPearance CLEAR (A) CLEAR   Glucose, UA NEGATIVE NEGATIVE mg/dL   Bilirubin Urine NEGATIVE NEGATIVE   Ketones, ur NEGATIVE NEGATIVE mg/dL   Specific Gravity, Urine 1.013 1.005 - 1.030   Hgb urine dipstick NEGATIVE NEGATIVE   pH 7.0 5.0 - 8.0   Protein, ur NEGATIVE NEGATIVE mg/dL   Nitrite NEGATIVE NEGATIVE   Leukocytes, UA NEGATIVE NEGATIVE   RBC / HPF 0-5 0 - 5 RBC/hpf   WBC, UA 0-5 0 - 5 WBC/hpf   Bacteria, UA NONE SEEN NONE SEEN   Squamous Epithelial / LPF 0-5 (A) NONE SEEN   Mucous PRESENT    US Abdomen Limited Ruq  Result Date: 06/01/2016 CLINICAL DATA:  Pain for 4 days. EXAM: US ABDOMEN LIMITED - RIGHT UPPER QUADRANT COMPARISON:  Ultrasound dated 05/29/2016. FINDINGS: Gallbladder: Again noted is a stone in the gallbladder neck region, measuring approximately 1.8 cm. A larger stone is again seen within the gallbladder fundus measuring at least 2.1 cm. Gallbladder walls now appear slightly thickened, measuring 4-5 mm, and there is suggestion of pericholecystic edema Common bile duct: Diameter: Remains normal at 4 mm Liver: Visualization again limited due to body habitus and  rib shadows. Visualized portions appear echogenic throughout suggesting fatty infiltration. No focal mass or lesion is identified IMPRESSION: 1. Gallbladder walls now appear mildly thickened measuring 4-5 mm thickness, a change from the previous exam, and there also now appears to be at least a small amount of pericholecystic edema. Findings are highly suspicious for a developing  acute cholecystitis. Consider nuclear medicine HIDA scan for confirmation. 2. Cholelithiasis. Again noted is a nonmobile stone in the gallbladder neck region. 3. No bile duct dilatation. 4. Probable fatty infiltration of the liver. Electronically Signed   By: Franki Cabot M.D.   On: 06/01/2016 17:36     Assessment/Plan  Labs and ultrasound reviewed. This a patient with known gallstones who presents with somewhat worsening pain and an ultrasound suggested worsening findings suggestive of acute cholecystitis early in nature. I have discussed with him the options of observation and discharge and scheduling an elective procedure versus admission to the hospital starting IV antibiotics and planning surgery in the next 24-48 hours rationale for this was discussed with the patient as were answered for him he understood and agreed to proceed he had been counseled concerning surgery by Dr. Adonis Huguenin in the past and I did not promise surgery for him tomorrow but will make him nothing by mouth in case scheduling can be obtained. I will discuss patient with Dr. Perrin Maltese for surgery tomorrow or the next day  Florene Glen, MD, FACS

## 2016-06-02 ENCOUNTER — Observation Stay: Payer: BC Managed Care – PPO | Admitting: Certified Registered"

## 2016-06-02 ENCOUNTER — Encounter: Payer: Self-pay | Admitting: Anesthesiology

## 2016-06-02 ENCOUNTER — Encounter
Admission: EM | Disposition: A | Discharge status: Home / Self Care | Payer: Self-pay | Source: Home / Self Care | Attending: Student in an Organized Health Care Education/Training Program

## 2016-06-02 HISTORY — PX: CHOLECYSTECTOMY: SHX55

## 2016-06-02 LAB — COMPREHENSIVE METABOLIC PANEL
ALBUMIN: 3.4 g/dL — AB (ref 3.5–5.0)
ALK PHOS: 53 U/L (ref 38–126)
ALT: 35 U/L (ref 17–63)
ANION GAP: 7 (ref 5–15)
AST: 29 U/L (ref 15–41)
BUN: 11 mg/dL (ref 6–20)
CALCIUM: 8.8 mg/dL — AB (ref 8.9–10.3)
CHLORIDE: 98 mmol/L — AB (ref 101–111)
CO2: 31 mmol/L (ref 22–32)
Creatinine, Ser: 1.04 mg/dL (ref 0.61–1.24)
Glucose, Bld: 101 mg/dL — ABNORMAL HIGH (ref 65–99)
Potassium: 3.4 mmol/L — ABNORMAL LOW (ref 3.5–5.1)
SODIUM: 136 mmol/L (ref 135–145)
Total Bilirubin: 0.9 mg/dL (ref 0.3–1.2)
Total Protein: 7.4 g/dL (ref 6.5–8.1)

## 2016-06-02 LAB — CBC
HCT: 39 % — ABNORMAL LOW (ref 40.0–52.0)
HEMOGLOBIN: 12.6 g/dL — AB (ref 13.0–18.0)
MCH: 26 pg (ref 26.0–34.0)
MCHC: 32.3 g/dL (ref 32.0–36.0)
MCV: 80.6 fL (ref 80.0–100.0)
Platelets: 235 10*3/uL (ref 150–440)
RBC: 4.84 MIL/uL (ref 4.40–5.90)
RDW: 14.3 % (ref 11.5–14.5)
WBC: 7.1 10*3/uL (ref 3.8–10.6)

## 2016-06-02 LAB — SURGICAL PCR SCREEN
MRSA, PCR: NEGATIVE
Staphylococcus aureus: POSITIVE — AB

## 2016-06-02 SURGERY — LAPAROSCOPIC CHOLECYSTECTOMY
Anesthesia: General

## 2016-06-02 MED ORDER — EPHEDRINE SULFATE 50 MG/ML IJ SOLN
INTRAMUSCULAR | Status: DC | PRN
  Administered 2016-06-02: 10 mg via INTRAVENOUS

## 2016-06-02 MED ORDER — OXYCODONE HCL 5 MG PO TABS: 5.0000 mg | ORAL_TABLET | Freq: Once | ORAL | Status: DC | PRN

## 2016-06-02 MED ORDER — SODIUM CHLORIDE 0.9 % IJ SOLN
INTRAMUSCULAR | Status: AC
  Filled 2016-06-02: qty 50

## 2016-06-02 MED ORDER — DEXAMETHASONE SODIUM PHOSPHATE 10 MG/ML IJ SOLN
INTRAMUSCULAR | Status: DC | PRN
  Administered 2016-06-02: 10 mg via INTRAVENOUS

## 2016-06-02 MED ORDER — PROPOFOL 10 MG/ML IV BOLUS
INTRAVENOUS | Status: DC | PRN
  Administered 2016-06-02: 200 mg via INTRAVENOUS

## 2016-06-02 MED ORDER — ACETAMINOPHEN 10 MG/ML IV SOLN
INTRAVENOUS | Status: DC | PRN
  Administered 2016-06-02: 1000 mg via INTRAVENOUS

## 2016-06-02 MED ORDER — PHENYLEPHRINE HCL 10 MG/ML IJ SOLN
INTRAMUSCULAR | Status: DC | PRN
  Administered 2016-06-02 (×6): 200 ug via INTRAVENOUS
  Administered 2016-06-02: 100 ug via INTRAVENOUS
  Administered 2016-06-02 (×2): 200 ug via INTRAVENOUS
  Administered 2016-06-02: 100 ug via INTRAVENOUS
  Administered 2016-06-02: 200 ug via INTRAVENOUS
  Administered 2016-06-02 (×2): 100 ug via INTRAVENOUS
  Administered 2016-06-02: 200 ug via INTRAVENOUS

## 2016-06-02 MED ORDER — SUGAMMADEX SODIUM 500 MG/5ML IV SOLN
INTRAVENOUS | Status: DC | PRN
  Administered 2016-06-02: 338 mg via INTRAVENOUS

## 2016-06-02 MED ORDER — BUPIVACAINE-EPINEPHRINE 0.25% -1:200000 IJ SOLN
INTRAMUSCULAR | Status: DC | PRN
  Administered 2016-06-02: 30 mL

## 2016-06-02 MED ORDER — FENTANYL CITRATE (PF) 100 MCG/2ML IJ SOLN
25.0000 ug | INTRAMUSCULAR | Status: DC | PRN
  Administered 2016-06-02 (×4): 25 ug via INTRAVENOUS

## 2016-06-02 MED ORDER — SUCCINYLCHOLINE CHLORIDE 20 MG/ML IJ SOLN
INTRAMUSCULAR | Status: DC | PRN
  Administered 2016-06-02: 120 mg via INTRAVENOUS

## 2016-06-02 MED ORDER — ONDANSETRON HCL 4 MG/2ML IJ SOLN
INTRAMUSCULAR | Status: DC | PRN
  Administered 2016-06-02: 4 mg via INTRAVENOUS

## 2016-06-02 MED ORDER — FENTANYL CITRATE (PF) 100 MCG/2ML IJ SOLN
INTRAMUSCULAR | Status: AC
  Filled 2016-06-02: qty 2

## 2016-06-02 MED ORDER — CHLORHEXIDINE GLUCONATE CLOTH 2 % EX PADS
6.0000 | MEDICATED_PAD | Freq: Once | CUTANEOUS | Status: AC
  Administered 2016-06-02: 6 via TOPICAL

## 2016-06-02 MED ORDER — ACETAMINOPHEN 10 MG/ML IV SOLN
INTRAVENOUS | Status: AC
  Filled 2016-06-02: qty 100

## 2016-06-02 MED ORDER — KETOROLAC TROMETHAMINE 30 MG/ML IJ SOLN
30.0000 mg | Freq: Four times a day (QID) | INTRAMUSCULAR | Status: DC
  Administered 2016-06-03 – 2016-06-05 (×9): 30 mg via INTRAVENOUS
  Filled 2016-06-02 (×9): qty 1

## 2016-06-02 MED ORDER — KETOROLAC TROMETHAMINE 30 MG/ML IJ SOLN
INTRAMUSCULAR | Status: DC | PRN
  Administered 2016-06-02: 30 mg via INTRAVENOUS

## 2016-06-02 MED ORDER — MIDAZOLAM HCL 2 MG/2ML IJ SOLN
INTRAMUSCULAR | Status: DC | PRN
  Administered 2016-06-02: 2 mg via INTRAVENOUS

## 2016-06-02 MED ORDER — BUPIVACAINE LIPOSOME 1.3 % IJ SUSP
INTRAMUSCULAR | Status: AC
  Filled 2016-06-02: qty 20

## 2016-06-02 MED ORDER — ACETAMINOPHEN 325 MG PO TABS
650.0000 mg | ORAL_TABLET | Freq: Once | ORAL | Status: AC
  Administered 2016-06-02: 650 mg via ORAL
  Filled 2016-06-02: qty 2

## 2016-06-02 MED ORDER — OXYCODONE HCL 5 MG/5ML PO SOLN: 5.0000 mg | Freq: Once | ORAL | Status: DC | PRN

## 2016-06-02 MED ORDER — LIDOCAINE HCL (CARDIAC) 20 MG/ML IV SOLN
INTRAVENOUS | Status: DC | PRN
  Administered 2016-06-02: 50 mg via INTRAVENOUS

## 2016-06-02 MED ORDER — FENTANYL CITRATE (PF) 100 MCG/2ML IJ SOLN
INTRAMUSCULAR | Status: DC | PRN
  Administered 2016-06-02: 50 ug via INTRAVENOUS
  Administered 2016-06-02: 100 ug via INTRAVENOUS
  Administered 2016-06-02: 50 ug via INTRAVENOUS

## 2016-06-02 MED ORDER — BUPIVACAINE LIPOSOME 1.3 % IJ SUSP
INTRAMUSCULAR | Status: DC | PRN
  Administered 2016-06-02: 20 mL

## 2016-06-02 MED ORDER — CHLORHEXIDINE GLUCONATE CLOTH 2 % EX PADS: 6.0000 | MEDICATED_PAD | Freq: Once | CUTANEOUS | Status: DC

## 2016-06-02 MED ORDER — ROCURONIUM BROMIDE 100 MG/10ML IV SOLN
INTRAVENOUS | Status: DC | PRN
  Administered 2016-06-02 (×3): 10 mg via INTRAVENOUS
  Administered 2016-06-02: 40 mg via INTRAVENOUS
  Administered 2016-06-02 (×4): 10 mg via INTRAVENOUS

## 2016-06-02 SURGICAL SUPPLY — 55 items
APPLICATOR COTTON TIP 6IN STRL (MISCELLANEOUS) IMPLANT
APPLIER CLIP 5 13 M/L LIGAMAX5 (MISCELLANEOUS) ×3
APPLIER CLIP ROT 10 11.4 M/L (STAPLE) ×3
BLADE SURG 15 STRL LF DISP TIS (BLADE) ×2 IMPLANT
BLADE SURG 15 STRL SS (BLADE) ×1
BULB RESERV EVAC DRAIN JP 100C (MISCELLANEOUS) ×3 IMPLANT
CANISTER SUCT 1200ML W/VALVE (MISCELLANEOUS) ×3 IMPLANT
CHLORAPREP W/TINT 26ML (MISCELLANEOUS) ×3 IMPLANT
CHOLANGIOGRAM CATH TAUT (CATHETERS) IMPLANT
CLEANER CAUTERY TIP 5X5 PAD (MISCELLANEOUS) ×2 IMPLANT
CLIP APPLIE 5 13 M/L LIGAMAX5 (MISCELLANEOUS) ×2 IMPLANT
CLIP APPLIE ROT 10 11.4 M/L (STAPLE) ×2 IMPLANT
DECANTER SPIKE VIAL GLASS SM (MISCELLANEOUS) IMPLANT
DEVICE TROCAR PUNCTURE CLOSURE (ENDOMECHANICALS) IMPLANT
DRAIN CHANNEL JP 19F (MISCELLANEOUS) ×3 IMPLANT
DRAPE C-ARM XRAY 36X54 (DRAPES) IMPLANT
DRAPE INCISE IOBAN 66X45 STRL (DRAPES) ×3 IMPLANT
DRSG TELFA 3X8 NADH (GAUZE/BANDAGES/DRESSINGS) ×3 IMPLANT
ELECT REM PT RETURN 9FT ADLT (ELECTROSURGICAL) ×3
ELECTRODE REM PT RTRN 9FT ADLT (ELECTROSURGICAL) ×2 IMPLANT
ENDOPOUCH RETRIEVER 10 (MISCELLANEOUS) ×3 IMPLANT
GAUZE SPONGE 4X4 12PLY STRL (GAUZE/BANDAGES/DRESSINGS) ×3 IMPLANT
GELPORT LAPAROSCOPIC (MISCELLANEOUS) ×3 IMPLANT
GLOVE BIO SURGEON STRL SZ7 (GLOVE) ×3 IMPLANT
GOWN STRL REUS W/ TWL LRG LVL3 (GOWN DISPOSABLE) ×4 IMPLANT
GOWN STRL REUS W/TWL LRG LVL3 (GOWN DISPOSABLE) ×2
HEMOSTAT SURGICEL 2X14 (HEMOSTASIS) ×9 IMPLANT
IRRIGATION STRYKERFLOW (MISCELLANEOUS) ×4 IMPLANT
IRRIGATOR STRYKERFLOW (MISCELLANEOUS) ×6
IV CATH ANGIO 12GX3 LT BLUE (NEEDLE) ×3 IMPLANT
IV NS 1000ML (IV SOLUTION) ×1
IV NS 1000ML BAXH (IV SOLUTION) ×2 IMPLANT
L-HOOK LAP DISP 36CM (ELECTROSURGICAL) ×6
LHOOK LAP DISP 36CM (ELECTROSURGICAL) ×4 IMPLANT
LIQUID BAND (GAUZE/BANDAGES/DRESSINGS) ×3 IMPLANT
NEEDLE HYPO 22GX1.5 SAFETY (NEEDLE) ×3 IMPLANT
PACK LAP CHOLECYSTECTOMY (MISCELLANEOUS) ×3 IMPLANT
PAD CLEANER CAUTERY TIP 5X5 (MISCELLANEOUS) ×1
PENCIL ELECTRO HAND CTR (MISCELLANEOUS) ×6 IMPLANT
SCISSORS METZENBAUM CVD 33 (INSTRUMENTS) ×3 IMPLANT
SLEEVE ENDOPATH XCEL 5M (ENDOMECHANICALS) ×6 IMPLANT
SOL ANTI-FOG 6CC FOG-OUT (MISCELLANEOUS) ×2 IMPLANT
SOL FOG-OUT ANTI-FOG 6CC (MISCELLANEOUS) ×1
SPONGE LAP 18X18 5 PK (GAUZE/BANDAGES/DRESSINGS) ×6 IMPLANT
STOPCOCK 3 WAY  REPLAC (MISCELLANEOUS) IMPLANT
SUT ETHIBOND 0 MO6 C/R (SUTURE) IMPLANT
SUT MNCRL AB 4-0 PS2 18 (SUTURE) IMPLANT
SUT VIC AB 0 CT2 27 (SUTURE) IMPLANT
SUT VICRYL 0 AB UR-6 (SUTURE) ×6 IMPLANT
SYR 20CC LL (SYRINGE) ×3 IMPLANT
TROCAR XCEL BLUNT TIP 100MML (ENDOMECHANICALS) ×3 IMPLANT
TROCAR XCEL NON-BLD 11X100MML (ENDOMECHANICALS) ×3 IMPLANT
TROCAR XCEL NON-BLD 5MMX100MML (ENDOMECHANICALS) ×3 IMPLANT
TUBING INSUFFLATOR HI FLOW (MISCELLANEOUS) ×3 IMPLANT
WATER STERILE IRR 1000ML POUR (IV SOLUTION) IMPLANT

## 2016-06-02 NOTE — Op Note (Signed)
Laparoscopic Cholecystectomy  Pre-operative Diagnosis: Acute cholecystitis  Post-operative Diagnosis: Gangrenous cholecystitis  Procedure: Hand-assisted laparoscopic cholecystectomy with extensive lysis of adhesions taking at least 1 hour total operative time. These note that this was a very sheared secondary to his body habitus and secondary to inflammatory response. Total operative time 2 1/2 hours  Surgeon: Sterling Bigiego Keiandra Sullenger, MD FACS  Anesthesia: Gen. with endotracheal tube   Findings: Gangrenous Cholecystitis   Estimated Blood Loss: 150cc         Drains: # 19 blake RUQ         Specimens: Gallbladder           Complications: none   Procedure Details  The patient was seen again in the Holding Room. The benefits, complications, treatment options, and expected outcomes were discussed with the patient. The risks of bleeding, infection, recurrence of symptoms, failure to resolve symptoms, bile duct damage, bile duct leak, retained common bile duct stone, bowel injury, any of which could require further surgery and/or ERCP, stent, or papillotomy were reviewed with the patient. The likelihood of improving the patient's symptoms with return to their baseline status is good.  The patient and/or family concurred with the proposed plan, giving informed consent.  The patient was taken to Operating Room, identified as Bethanie DickerFreddy Detty and the procedure verified as Laparoscopic Cholecystectomy.  A Time Out was held and the above information confirmed.  Prior to the induction of general anesthesia, antibiotic prophylaxis was administered. VTE prophylaxis was in place. General endotracheal anesthesia was then administered and tolerated well. After the induction, the abdomen was prepped with Chloraprep and draped in the sterile fashion. The patient was positioned in the supine position.  Local anesthetic  was injected into the skin near the umbilicus and an incision made. Cut down technique was used to enter  the abdominal cavity and a Hasson trochar was placed after two vicryl stitches were anchored to the fascia. Pneumoperitoneum was then created with CO2 and tolerated well without any adverse changes in the patient's vital signs.  Three 5-mm ports were placed in the right upper quadrant all under direct vision. All skin incisions  were infiltrated with a local anesthetic agent before making the incision and placing the trocars.   The patient was positioned  in reverse Trendelenburg, tilted slightly to the patient's left.    Operative entering the abdomen we noted significant inflammatory response with hydrops. Were able to aspirate the gallbladder. No significant adhesions from the omentum to the gallbladder from the omentum to the liver. These were lysed in a combination of scissors and electrocautery. An this was extensive. He has significant inflammatory response around the infundibulum of the gallbladder. And because of this and difficulty with exposure due to his body habitus I decided to go ahead and convert to a hand-assisted and cholecystectomy. A minilaparotomy was performed and the GelPort device was inserted. I was able to and take the gallbladder dome down due to significant inflammatory response around the infundibular and cystic duct junction. And once I was able to remove the gallbladder from its fossa I was able to identify the cystic duct that was short. Also cystic artery was double clipped in the standard fashion.  The cystic duct had significant inflammatory response along with the infundibulum. I was able to finally dissected cystic duct and an put 2 clips, 4 to one of the clips fell off and I had to redo the clipping of the cystic duct stump. Please note that the cystic duct was  very short and close to the common bile duct but I was able to identify the common bile duct. The gallbladder was removed and there was some areas on the liver side that needed extra cautery and also I placed a  couple of Surgicel and pressure. There was good hemostasis  Inspection of the right upper quadrant was performed. No bleeding, bile duct injury or leak, or bowel injury was noted. Pneumoperitoneum was released and all the laparoscopic ports were removed as well as the gallbladder..   Exparel was injected along all the incision sites and the midline fascia was closed in a running fashion with 0 PDS suture. We were able to place a 19 Blake drain the right upper quadrant and sutured in place with 3-0 nylon's. The skin was closed with staplers. Sterile dressing applied. The patient was then extubated and brought to the recovery room in stable condition. Sponge, lap, and needle counts were correct at closure and at the conclusion of the case.               Sterling Big, MD, FACS

## 2016-06-02 NOTE — Anesthesia Procedure Notes (Signed)
Procedure Name: Intubation Performed by: Mathews ArgyleLOGAN, Alexxa Sabet Pre-anesthesia Checklist: Patient identified, Patient being monitored, Timeout performed, Emergency Drugs available and Suction available Patient Re-evaluated:Patient Re-evaluated prior to inductionOxygen Delivery Method: Circle system utilized Preoxygenation: Pre-oxygenation with 100% oxygen Intubation Type: IV induction, Rapid sequence and Cricoid Pressure applied Laryngoscope Size: Miller and 2 Grade View: Grade I Tube type: Oral Tube size: 7.5 mm Number of attempts: 1 Airway Equipment and Method: Stylet and Patient positioned with wedge pillow Placement Confirmation: ETT inserted through vocal cords under direct vision,  positive ETCO2 and breath sounds checked- equal and bilateral Secured at: 22 cm Tube secured with: Tape Dental Injury: Teeth and Oropharynx as per pre-operative assessment

## 2016-06-02 NOTE — Consult Note (Signed)
Pt asleep; CH spoke with RN and gave RN Advanced Directive [employer]; CH available in AM to review with pt at pt re quest; CH to follow up for AD/HCPOA. Nathan Burch 2:05 AM

## 2016-06-02 NOTE — Transfer of Care (Signed)
Immediate Anesthesia Transfer of Care Note  Patient: Nathan DickerFreddy Nuckles  Procedure(s) Performed: Procedure(s): LAPAROSCOPIC CHOLECYSTECTOMY  Patient Location: PACU  Anesthesia Type:General  Level of Consciousness: awake and alert   Airway & Oxygen Therapy: Patient Spontanous Breathing and Patient connected to face mask oxygen  Post-op Assessment: Report given to RN and Post -op Vital signs reviewed and stable  Post vital signs: Reviewed and stable  Last Vitals:  Vitals:   06/02/16 1030 06/02/16 1255  BP: (!) 142/69 127/81  Pulse: 70 88  Resp:  18  Temp:  36.9 C    Last Pain:  Vitals:   06/02/16 1311  TempSrc:   PainSc: 0-No pain         Complications: No apparent anesthesia complications

## 2016-06-02 NOTE — Anesthesia Postprocedure Evaluation (Signed)
Anesthesia Post Note  Patient: Bethanie DickerFreddy Chittum  Procedure(s) Performed: Procedure(s): LAPAROSCOPIC CHOLECYSTECTOMY  Patient location during evaluation: PACU Anesthesia Type: General Level of consciousness: awake and alert Pain management: pain level controlled Vital Signs Assessment: post-procedure vital signs reviewed and stable Respiratory status: spontaneous breathing, nonlabored ventilation, respiratory function stable and patient connected to nasal cannula oxygen Cardiovascular status: blood pressure returned to baseline and stable Postop Assessment: no signs of nausea or vomiting Anesthetic complications: no    Last Vitals:  Vitals:   06/02/16 2030 06/02/16 2125  BP: 135/75 (!) 150/86  Pulse: 90 95  Resp: 18 18  Temp: 36.5 C 36.6 C    Last Pain:  Vitals:   06/02/16 2125  TempSrc: Oral  PainSc:                  Cleda MccreedyJoseph K Mykai Wendorf

## 2016-06-02 NOTE — Anesthesia Preprocedure Evaluation (Signed)
Anesthesia Evaluation  Patient identified by MRN, date of birth, ID band Patient awake    Reviewed: Allergy & Precautions, H&P , NPO status , Patient's Chart, lab work & pertinent test results  Airway Mallampati: III  TM Distance: >3 FB Neck ROM: full    Dental no notable dental hx. (+) Poor Dentition, Chipped   Pulmonary neg shortness of breath,    Pulmonary exam normal breath sounds clear to auscultation       Cardiovascular Exercise Tolerance: Good hypertension, (-) angina(-) Past MI and (-) DOE Normal cardiovascular exam Rhythm:regular Rate:Normal     Neuro/Psych negative neurological ROS  negative psych ROS   GI/Hepatic negative GI ROS, Neg liver ROS, neg GERD  ,  Endo/Other  negative endocrine ROS  Renal/GU      Musculoskeletal   Abdominal   Peds  Hematology negative hematology ROS (+)   Anesthesia Other Findings Past Medical History: No date: Hypertension  Past Surgical History: No date: NO PAST SURGERIES  BMI    Body Mass Index:  51.97 kg/m      Reproductive/Obstetrics negative OB ROS                             Anesthesia Physical Anesthesia Plan  ASA: III  Anesthesia Plan: General ETT and Rapid Sequence   Post-op Pain Management:    Induction:   Airway Management Planned:   Additional Equipment:   Intra-op Plan:   Post-operative Plan:   Informed Consent: I have reviewed the patients History and Physical, chart, labs and discussed the procedure including the risks, benefits and alternatives for the proposed anesthesia with the patient or authorized representative who has indicated his/her understanding and acceptance.     Plan Discussed with: Anesthesiologist, CRNA and Surgeon  Anesthesia Plan Comments:         Anesthesia Quick Evaluation

## 2016-06-02 NOTE — Progress Notes (Signed)
Preoperative Review   Patient is met in the preoperative holding area. The history is reviewed in the chart and with the patient. I personally reviewed the options and rationale as well as the risks of this procedure that have been previously discussed with the patient. All questions asked by the patient and/or family were answered to their satisfaction.  Patient agrees to proceed with this procedure at this time. Plan for laparoscopic cholecystectomy possible open. Discussed with patient in detail about the risks, benefits and possible complications including but not limited to: Bleeding, common bile duct injury, infection, re-intervention, persistent pain. He understands and wishes to proceed  Caroleen Hamman M.D. FACS

## 2016-06-03 ENCOUNTER — Encounter: Payer: Self-pay | Admitting: Surgery

## 2016-06-03 LAB — COMPREHENSIVE METABOLIC PANEL
ALT: 83 U/L — AB (ref 17–63)
AST: 70 U/L — AB (ref 15–41)
Albumin: 3.5 g/dL (ref 3.5–5.0)
Alkaline Phosphatase: 59 U/L (ref 38–126)
Anion gap: 8 (ref 5–15)
BILIRUBIN TOTAL: 0.6 mg/dL (ref 0.3–1.2)
BUN: 12 mg/dL (ref 6–20)
CHLORIDE: 99 mmol/L — AB (ref 101–111)
CO2: 28 mmol/L (ref 22–32)
CREATININE: 1.09 mg/dL (ref 0.61–1.24)
Calcium: 9 mg/dL (ref 8.9–10.3)
Glucose, Bld: 149 mg/dL — ABNORMAL HIGH (ref 65–99)
POTASSIUM: 3.9 mmol/L (ref 3.5–5.1)
Sodium: 135 mmol/L (ref 135–145)
TOTAL PROTEIN: 7.6 g/dL (ref 6.5–8.1)

## 2016-06-03 LAB — CBC
HEMATOCRIT: 39.1 % — AB (ref 40.0–52.0)
HEMOGLOBIN: 12.7 g/dL — AB (ref 13.0–18.0)
MCH: 26 pg (ref 26.0–34.0)
MCHC: 32.6 g/dL (ref 32.0–36.0)
MCV: 79.8 fL — ABNORMAL LOW (ref 80.0–100.0)
Platelets: 267 10*3/uL (ref 150–440)
RBC: 4.9 MIL/uL (ref 4.40–5.90)
RDW: 13.9 % (ref 11.5–14.5)
WBC: 12.1 10*3/uL — AB (ref 3.8–10.6)

## 2016-06-03 MED ORDER — OXYCODONE-ACETAMINOPHEN 7.5-325 MG PO TABS
2.0000 | ORAL_TABLET | ORAL | Status: DC | PRN
  Administered 2016-06-03: 1 via ORAL
  Administered 2016-06-04: 2 via ORAL
  Administered 2016-06-05: 1 via ORAL
  Filled 2016-06-03 (×2): qty 2
  Filled 2016-06-03: qty 1
  Filled 2016-06-03: qty 2

## 2016-06-03 NOTE — Progress Notes (Signed)
POD # 1  Doing well Pain improving AVSS bil nml, mild inc ast, alt as expected Taking clears  PE NAD Abd: soft, dresing intact. Serosanguinous drainage from JP no bile  A/P doing well Needs to continue A/Bs advance diet Mobilize DC in am?

## 2016-06-04 LAB — COMPREHENSIVE METABOLIC PANEL
ALT: 52 U/L (ref 17–63)
ANION GAP: 7 (ref 5–15)
AST: 36 U/L (ref 15–41)
Albumin: 3 g/dL — ABNORMAL LOW (ref 3.5–5.0)
Alkaline Phosphatase: 48 U/L (ref 38–126)
BILIRUBIN TOTAL: 0.3 mg/dL (ref 0.3–1.2)
BUN: 18 mg/dL (ref 6–20)
CHLORIDE: 100 mmol/L — AB (ref 101–111)
CO2: 31 mmol/L (ref 22–32)
Calcium: 8.7 mg/dL — ABNORMAL LOW (ref 8.9–10.3)
Creatinine, Ser: 0.98 mg/dL (ref 0.61–1.24)
GFR calc Af Amer: >60 mL/min (ref >60)
Glucose, Bld: 120 mg/dL — ABNORMAL HIGH (ref 65–99)
POTASSIUM: 3.7 mmol/L (ref 3.5–5.1)
Sodium: 138 mmol/L (ref 135–145)
TOTAL PROTEIN: 6.9 g/dL (ref 6.5–8.1)

## 2016-06-04 MED ORDER — ALUM & MAG HYDROXIDE-SIMETH 200-200-20 MG/5ML PO SUSP
30.0000 mL | ORAL | Status: DC | PRN
  Administered 2016-06-04: 30 mL via ORAL
  Filled 2016-06-04: qty 30

## 2016-06-04 NOTE — Progress Notes (Signed)
POD # 2 gangrenous cholecystitis Feeling better today Starting taking regular diet Still has some significant pain + flatus Minimal JP output  PE NAD Abd: staples in place , no infection, no peritonitis, JP dark fluid ? surgicell  A/P doing well Continue a/bs Mobilize , still has significant pain and not ready for DC today DC in am

## 2016-06-05 MED ORDER — OXYCODONE-ACETAMINOPHEN 7.5-325 MG PO TABS: 2.0000 | ORAL_TABLET | ORAL | 0 refills | Status: DC | PRN

## 2016-06-05 NOTE — Discharge Summary (Signed)
Patient ID: Nathan DickerFreddy Burch MRN: [id number] DOB/AGE: 1971-01-13 45 y.o.  Admit date: 06/01/2016 Discharge date: 06/05/2016   Discharge Diagnoses:  Active Problems:   Acute cholecystitis   Procedures: Hand Assisted laparoscopic cholecystectomy  Hospital Course: This is a very nice 45 year old male with a significant history of morbid obesity was admitted through the emergency room secondary to acute cholecystitis. This process has been going on for about 6 days or so and continue to have symptoms. He actually saw one off My partners couple of days prior and at that time clinically and laboratory evidence as well as imaging studies did not suggest cholecystitis. When we reexamine him in the ER he was more tender and the ultrasound was suggestive of cholecystitis. I took him to the operating room for a laparoscopic cystectomy but he had a gangrenous very difficult gallbladder and I needed the assistance of my hand to be able to discern his anatomy better and have a better exposure. This was a lengthy procedure secondary to the degree of inflammatory response and his body habitus. He was kept in the hospital for 2 more nights where he received antibiotics and adequate narcotics for pain. At the time of discharge he was ambulating, he was tolerating a regular diet, he was afebrile and his vital signs were stable. His physical exam revealed an obese male in no acute distress his stress. It awake, alert. Abdomen: Soft staplers in place no evidence of infection and with minimal JP output. Extremities: Well perfused and warm. Condition of the patient at the time of discharge is stable   Disposition: 01-Home or Self Care  Discharge Instructions    Call MD for:  difficulty breathing, headache or visual disturbances    Complete by:  As directed    Call MD for:  extreme fatigue    Complete by:  As directed    Call MD for:  hives    Complete by:  As directed    Call MD for:  persistant dizziness or  light-headedness    Complete by:  As directed    Call MD for:  persistant nausea and vomiting    Complete by:  As directed    Call MD for:  redness, tenderness, or signs of infection (pain, swelling, redness, odor or green/yellow discharge around incision site)    Complete by:  As directed    Call MD for:  severe uncontrolled pain    Complete by:  As directed    Call MD for:  temperature >100.4    Complete by:  As directed    Change dressing (specify)    Complete by:  As directed    Empty JP BID   Diet - low sodium heart healthy    Complete by:  As directed    Discharge instructions    Complete by:  As directed    Please teach pt about JP care   Increase activity slowly    Complete by:  As directed    Lifting restrictions    Complete by:  As directed    20 lbs x 6 weeks       Medication List    STOP taking these medications   oxyCODONE 5 MG immediate release tablet Commonly known as:  ROXICODONE     TAKE these medications   Olmesartan-Amlodipine-HCTZ 40-10-25 MG Tabs Take 1 tablet by mouth every morning.   ondansetron 4 MG disintegrating tablet Commonly known as:  ZOFRAN ODT Take 1 tablet (4 mg total) by mouth every  8 (eight) hours as needed for nausea or vomiting.   oxyCODONE-acetaminophen 7.5-325 MG tablet Commonly known as:  PERCOCET Take 2 tablets by mouth every 4 (four) hours as needed for moderate pain (May use 1 tab for mild pain).         Sterling Big, MD FACS

## 2016-06-05 NOTE — Progress Notes (Signed)
Pt d/c home; d/c instructions reviewed w/ pt; pt understanding was verbalized; IV removed, catheter in tact, gauze dressing applied; all pt questions answered; pt verbalized that all pt belongings were accounted for; pt teaching and demonstration done on JP drain care, emptying and recharging, pt verbalized understanding and performed return demonstration and teach-back; pt left unit via wheelchair accompanied by staff

## 2016-06-05 NOTE — Discharge Instructions (Signed)
Follow all MD discharge instructions. Take all medications as prescribed. Keep all follow up appointments. If your symptoms return, call your doctor. If you experience any new symptoms that are of concern to you or that are bothersome to you, call your doctor. For all questions and/or concerns, call your doctor.  If you experience any bleeding or discharge from your incision sites, redness or swelling at your incision sites, fever, pain uncontrolled by medication, increase in pain, or any nausea, call your doctor.   You are being discharges with a JP drain in place. Be sure to empty and then "recharge" the drain at least twice per day, and as needed when half full. Document how much comes out of your drain. Keep the area around the drain clean and dry. If you experience pain at the site or bleeding, notify your doctor.    If you have a medical emergency, call 911

## 2016-06-06 LAB — SURGICAL PATHOLOGY

## 2016-06-09 ENCOUNTER — Ambulatory Visit (INDEPENDENT_AMBULATORY_CARE_PROVIDER_SITE_OTHER): Payer: BC Managed Care – PPO | Admitting: Surgery

## 2016-06-09 ENCOUNTER — Encounter: Payer: Self-pay | Admitting: Surgery

## 2016-06-09 ENCOUNTER — Other Ambulatory Visit
Admission: RE | Admit: 2016-06-09 | Discharge: 2016-06-09 | Disposition: A | Discharge status: Home / Self Care | Payer: BC Managed Care – PPO | Source: Ambulatory Visit | Attending: Surgery | Admitting: Surgery

## 2016-06-09 VITALS — BP 133/88 | HR 90 | Temp 98.3°F | Ht 71.5 in | Wt 370.0 lb

## 2016-06-09 DIAGNOSIS — R1084 Generalized abdominal pain: Secondary | ICD-10-CM

## 2016-06-09 DIAGNOSIS — Z09 Encounter for follow-up examination after completed treatment for conditions other than malignant neoplasm: Secondary | ICD-10-CM

## 2016-06-09 LAB — COMPREHENSIVE METABOLIC PANEL
ALT: 53 U/L (ref 17–63)
ANION GAP: 9 (ref 5–15)
AST: 30 U/L (ref 15–41)
Albumin: 3.5 g/dL (ref 3.5–5.0)
Alkaline Phosphatase: 50 U/L (ref 38–126)
BUN: 14 mg/dL (ref 6–20)
CHLORIDE: 99 mmol/L — AB (ref 101–111)
CO2: 28 mmol/L (ref 22–32)
Calcium: 9 mg/dL (ref 8.9–10.3)
Creatinine, Ser: 1.14 mg/dL (ref 0.61–1.24)
Glucose, Bld: 93 mg/dL (ref 65–99)
POTASSIUM: 3.9 mmol/L (ref 3.5–5.1)
Sodium: 136 mmol/L (ref 135–145)
Total Bilirubin: 0.3 mg/dL (ref 0.3–1.2)
Total Protein: 7.6 g/dL (ref 6.5–8.1)

## 2016-06-09 LAB — CBC WITH DIFFERENTIAL/PLATELET
BASOS ABS: 0 10*3/uL (ref 0–0.1)
BASOS PCT: 1 %
EOS PCT: 3 %
Eosinophils Absolute: 0.2 10*3/uL (ref 0–0.7)
HCT: 36.6 % — ABNORMAL LOW (ref 40.0–52.0)
Hemoglobin: 12.3 g/dL — ABNORMAL LOW (ref 13.0–18.0)
LYMPHS PCT: 26 %
Lymphs Abs: 1.7 10*3/uL (ref 1.0–3.6)
MCH: 26.4 pg (ref 26.0–34.0)
MCHC: 33.5 g/dL (ref 32.0–36.0)
MCV: 78.8 fL — AB (ref 80.0–100.0)
MONO ABS: 0.5 10*3/uL (ref 0.2–1.0)
Monocytes Relative: 8 %
Neutro Abs: 4.1 10*3/uL (ref 1.4–6.5)
Neutrophils Relative %: 62 %
PLATELETS: 310 10*3/uL (ref 150–440)
RBC: 4.64 MIL/uL (ref 4.40–5.90)
RDW: 14.4 % (ref 11.5–14.5)
WBC: 6.7 10*3/uL (ref 3.8–10.6)

## 2016-06-09 NOTE — Patient Instructions (Addendum)
[facility].  Your HIDA Scan appointment will [facility] on 10/10/217 [facility]. Nothing to eat or drink after midnight. This test will last two hours. Please be off your Hydrocodone for 24 hours prior to your appointment.

## 2016-06-10 NOTE — Progress Notes (Signed)
POD # 7 s/p hand assisted chole It was a very difficult case secondary to gangrene of the GB, adhesions and Body habitus He has a JP w 15-20cc daily AVSS Taking PO, no fevers  PE NAD, morbidly obese male Abd: soft, staples in place. Some bilious fluid on JP  A/P concern for low output bile leak HIDA scan r/o leak. Likely either Luschka or cystic duct stump leak. Not surprising given the degree of gangrene and likely clip did not hol Not septic or toxic, no evidence of cholangitis We will see him in a few days and If a leak still present will need ERCP w stent D/w the pt in detail and he understands No need for emergent procedures at this time

## 2016-06-14 ENCOUNTER — Encounter: Admission: RE | Admit: 2016-06-14 | Payer: BC Managed Care – PPO | Source: Ambulatory Visit

## 2016-06-15 ENCOUNTER — Ambulatory Visit (INDEPENDENT_AMBULATORY_CARE_PROVIDER_SITE_OTHER): Payer: BC Managed Care – PPO | Admitting: Surgery

## 2016-06-15 ENCOUNTER — Telehealth: Payer: Self-pay

## 2016-06-15 DIAGNOSIS — K8000 Calculus of gallbladder with acute cholecystitis without obstruction: Secondary | ICD-10-CM

## 2016-06-15 NOTE — Progress Notes (Signed)
06/15/2016  HPI: Patient is status post hand-assisted cholecystectomy with Dr. Everlene FarrierPabon on 9/28 for gangrenous cholecystitis. A JP drain was left in the liver bed. He was seen by Dr. Everlene FarrierPabon last week but his JP drain still had somewhat bilious fluid in it. A HIDA scan was ordered for an outpatient by the patient misted due to miscommunication on the phone. Otherwise the patient reports that he's been doing very well with normal bowel movements no significant pain, no fevers, no chills, tolerating diet well and with no other drainage from his wounds. He does report that the staples are somewhat itchy. He reports that his drain has been putting out small amount of fluid only and has been clearing up more.  Vital signs: Vital signs were reviewed and are within normal limits  Physical Exam: Constitutional: No acute distress Abdomen: Soft, obese, nondistended, nontender. Incisions are healing well with staples in place and no evidence of cellulitis drainage or infection. JP drain is becoming more clear in character but still with a tinge of bile in it.  Assessment/Plan: This is a 45 year old male status post hand-assisted cholecystectomy on 9/28 for gangrenous cholecystitis with a suspicion of biliary leak on last week's clinic visit. Even though the patient missed his HIDA scan, currently it appears that the leak has been sealing on its own as now the character of the fluid is becoming much more clear. However there is still a tinge of bile color in the fluid and will does leave the drain in place for now. Since the leak is improving will not order any further studies at this moment. He will follow-up next week with Dr. Everlene FarrierPabon in the clinic at which time I would expect the drain can be removed. Today we will DC his staples and change them for Steri-Strips.   Nathan IllJose Luis Brittany Osier, MD [facility]

## 2016-06-15 NOTE — Patient Instructions (Addendum)
Patient may shower, but do not scrub wounds heavily and dab dry only.  Do not pull JP drain and it will be removed in clinic next week.  Follow up next week with Dr. Everlene FarrierPabon in office. Please see your follow up appointment listed below.

## 2016-06-15 NOTE — Telephone Encounter (Signed)
LVM for patient to call office. Please let patient him know his follow up appointment next week is on 06/21/16 @ 10:15 with Dr.Pabon.

## 2016-06-17 ENCOUNTER — Telehealth: Payer: Self-pay

## 2016-06-17 ENCOUNTER — Emergency Department: Payer: BC Managed Care – PPO

## 2016-06-17 ENCOUNTER — Emergency Department
Admission: EM | Admit: 2016-06-17 | Discharge: 2016-06-18 | Disposition: A | Discharge status: Home / Self Care | Payer: BC Managed Care – PPO | Attending: Emergency Medicine | Admitting: Emergency Medicine

## 2016-06-17 ENCOUNTER — Encounter: Payer: Self-pay | Admitting: *Deleted

## 2016-06-17 DIAGNOSIS — I10 Essential (primary) hypertension: Secondary | ICD-10-CM | POA: Diagnosis not present

## 2016-06-17 DIAGNOSIS — J069 Acute upper respiratory infection, unspecified: Secondary | ICD-10-CM

## 2016-06-17 DIAGNOSIS — Z9049 Acquired absence of other specified parts of digestive tract: Secondary | ICD-10-CM | POA: Insufficient documentation

## 2016-06-17 DIAGNOSIS — M545 Low back pain, unspecified: Secondary | ICD-10-CM

## 2016-06-17 DIAGNOSIS — R5082 Postprocedural fever: Secondary | ICD-10-CM

## 2016-06-17 DIAGNOSIS — R509 Fever, unspecified: Secondary | ICD-10-CM | POA: Diagnosis present

## 2016-06-17 DIAGNOSIS — K8000 Calculus of gallbladder with acute cholecystitis without obstruction: Secondary | ICD-10-CM

## 2016-06-17 DIAGNOSIS — Z79899 Other long term (current) drug therapy: Secondary | ICD-10-CM | POA: Insufficient documentation

## 2016-06-17 LAB — COMPREHENSIVE METABOLIC PANEL
ALBUMIN: 3.3 g/dL — AB (ref 3.5–5.0)
ALK PHOS: 65 U/L (ref 38–126)
ALT: 36 U/L (ref 17–63)
AST: 29 U/L (ref 15–41)
Anion gap: 8 (ref 5–15)
BILIRUBIN TOTAL: 0.7 mg/dL (ref 0.3–1.2)
BUN: 11 mg/dL (ref 6–20)
CALCIUM: 8.7 mg/dL — AB (ref 8.9–10.3)
CO2: 27 mmol/L (ref 22–32)
CREATININE: 0.89 mg/dL (ref 0.61–1.24)
Chloride: 100 mmol/L — ABNORMAL LOW (ref 101–111)
GFR calc Af Amer: >60 mL/min (ref >60)
GFR calc non Af Amer: >60 mL/min (ref >60)
GLUCOSE: 121 mg/dL — AB (ref 65–99)
Potassium: 3.6 mmol/L (ref 3.5–5.1)
SODIUM: 135 mmol/L (ref 135–145)
TOTAL PROTEIN: 7.4 g/dL (ref 6.5–8.1)

## 2016-06-17 LAB — URINALYSIS COMPLETE WITH MICROSCOPIC (ARMC ONLY)
BACTERIA UA: NONE SEEN
BILIRUBIN URINE: NEGATIVE
Glucose, UA: NEGATIVE mg/dL
Hgb urine dipstick: NEGATIVE
KETONES UR: NEGATIVE mg/dL
Leukocytes, UA: NEGATIVE
NITRITE: NEGATIVE
PH: 6 (ref 5.0–8.0)
PROTEIN: 30 mg/dL — AB
Specific Gravity, Urine: 1.019 (ref 1.005–1.030)
Squamous Epithelial / LPF: NONE SEEN

## 2016-06-17 LAB — CBC WITH DIFFERENTIAL/PLATELET
BASOS PCT: 0 %
Basophils Absolute: 0.1 10*3/uL (ref 0–0.1)
EOS ABS: 0.1 10*3/uL (ref 0–0.7)
Eosinophils Relative: 1 %
HEMATOCRIT: 36 % — AB (ref 40.0–52.0)
HEMOGLOBIN: 11.9 g/dL — AB (ref 13.0–18.0)
Lymphocytes Relative: 10 %
Lymphs Abs: 1.6 10*3/uL (ref 1.0–3.6)
MCH: 26.1 pg (ref 26.0–34.0)
MCHC: 33 g/dL (ref 32.0–36.0)
MCV: 79.3 fL — ABNORMAL LOW (ref 80.0–100.0)
MONOS PCT: 6 %
Monocytes Absolute: 1 10*3/uL (ref 0.2–1.0)
NEUTROS ABS: 13.1 10*3/uL — AB (ref 1.4–6.5)
NEUTROS PCT: 83 %
Platelets: 348 10*3/uL (ref 150–440)
RBC: 4.55 MIL/uL (ref 4.40–5.90)
RDW: 14.3 % (ref 11.5–14.5)
WBC: 15.9 10*3/uL — AB (ref 3.8–10.6)

## 2016-06-17 LAB — LIPASE, BLOOD: Lipase: 24 U/L (ref 11–51)

## 2016-06-17 MED ORDER — IOPAMIDOL (ISOVUE-300) INJECTION 61%
125.0000 mL | Freq: Once | INTRAVENOUS | Status: AC | PRN
  Administered 2016-06-17: 125 mL via INTRAVENOUS

## 2016-06-17 MED ORDER — IOPAMIDOL (ISOVUE-300) INJECTION 61%
30.0000 mL | Freq: Once | INTRAVENOUS | Status: AC
  Administered 2016-06-17: 30 mL via ORAL

## 2016-06-17 MED ORDER — KETOROLAC TROMETHAMINE 30 MG/ML IJ SOLN
30.0000 mg | Freq: Once | INTRAMUSCULAR | Status: AC
  Administered 2016-06-17: 30 mg via INTRAVENOUS
  Filled 2016-06-17: qty 1

## 2016-06-17 MED ORDER — PIPERACILLIN-TAZOBACTAM 3.375 G IVPB: 3.3750 g | Freq: Once | INTRAVENOUS | Status: DC

## 2016-06-17 NOTE — ED Provider Notes (Addendum)
[facility] Emergency Department Provider Note  ____________________________________________   I have reviewed the triage vital signs and the nursing notes.   HISTORY  Chief Complaint Fever    HPI Nathan Burch is a 45 y.o. male presents today complaining of feeling some degree of fever and getting sweaty at home having chills. Patient had a rapid strep a cholecystectomy on September 28. He had a 2 day stay in the hospital was sent home with a Jackson-Pratt drain. Patient states he has had minimal drainage from the drain. He states over the last couple days she's had some fever. Patient also states he bent over and hurt his back and this seems to be subdental event. He denies any numbness or weakness or radiation of the pain he has low back pain that hurts when he touches it hurts when he changes position, he has no flank pain there is no radiation no incontinence of bowel or bladder and no actual trauma. Patient states that he has had a cough recently which is nonproductive and mild rhinorrhea.      Past Medical History:  Diagnosis Date  . Hypertension     Patient Active Problem List   Diagnosis Date Noted  . Calculus of gallbladder with acute cholecystitis without obstruction   . Acute cholecystitis   . Prediabetes 03/29/2015  . Morbid obesity with BMI of 50.0-59.9, adult (HCC) 12/05/2013  . Idiopathic hypertension 12/14/2012  . Hyperlipidemia 12/14/2012    Past Surgical History:  Procedure Laterality Date  . CHOLECYSTECTOMY  06/02/2016   Procedure: LAPAROSCOPIC CHOLECYSTECTOMY;  Surgeon: Leafy Ro, MD;  Location: [facility] ORS;  Service: General;;  . NO PAST SURGERIES      Prior to Admission medications   Medication Sig Start Date End Date Taking? Authorizing Provider  ibuprofen (ADVIL,MOTRIN) 200 MG tablet Take 200 mg by mouth every 6 (six) hours as needed.    Historical Provider, MD  Olmesartan-Amlodipine-HCTZ 40-10-25 MG TABS Take 1 tablet by  mouth every morning.  12/09/15   Historical Provider, MD  oxyCODONE-acetaminophen (PERCOCET) 7.5-325 MG tablet Take 2 tablets by mouth every 4 (four) hours as needed for moderate pain (May use 1 tab for mild pain). 06/05/16   Diego Ronnette Juniper, MD    Allergies Review of patient's allergies indicates no known allergies.  Family History  Problem Relation Age of Onset  . Heart disease Mother   . Diabetes Mother   . Hypertension Mother   . Heart disease Father   . Hypertension Father     Social History Social History  Substance Use Topics  . Smoking status: Never Smoker  . Smokeless tobacco: Never Used  . Alcohol use No    Review of Systems Constitutional: Subjective fever and chills at home Eyes: No visual changes. ENT: No sore throat. No stiff neck no neck pain Cardiovascular: Denies chest pain. Respiratory: Denies shortness of breath. Positive cough Gastrointestinal:   no vomiting.  No diarrhea.  No constipation. Genitourinary: Negative for dysuria. Musculoskeletal: Negative lower extremity swelling Skin: Negative for rash. Neurological: Negative for severe headaches, focal weakness or numbness. 10-point ROS otherwise negative.  ____________________________________________   PHYSICAL EXAM:  VITAL SIGNS: ED Triage Vitals [06/17/16 1918]  Enc Vitals Group     BP (!) 133/58     Pulse Rate (!) 122     Resp 20     Temp 99.3 F (37.4 C)     Temp Source Oral     SpO2 99 %  Weight (!) 379 lb (171.9 kg)     Height 5\' 11"  (1.803 m)     Head Circumference      Peak Flow      Pain Score 5     Pain Loc      Pain Edu?      Excl. in GC?     Constitutional: Alert and oriented. Well appearing and in no acute distress. Eyes: Conjunctivae are normal. PERRL. EOMI. Head: Atraumatic. Nose: No congestion/rhinnorhea. Mouth/Throat: Mucous membranes are moist.  Oropharynx non-erythematous. Neck: No stridor.   Nontender with no meningismus Cardiovascular: Normal rate, regular  rhythm. Grossly normal heart sounds.  Good peripheral circulation. Respiratory: Normal respiratory effort.  No retractions. Lungs CTAB. Abdominal: Soft and Mild tenderness noted, incision site is intact, Jackson-Pratt drain is intact and there is no evidence of surrounding cellulitic changes, there is minimal drainage which does not appear to be ileus in the drain itself.. No distention. No guarding no rebound Back:  Is minimal tenderness to palpation in the left lower back.  there is no midline tenderness there are no lesions noted. there is no CVA tenderness Musculoskeletal: No lower extremity tenderness, no upper extremity tenderness. No joint effusions, no DVT signs strong distal pulses no edema Neurologic:  Normal speech and language. No gross focal neurologic deficits are appreciated.  Skin:  Skin is warm, dry and intact. No rash noted. Psychiatric: Mood and affect are normal. Speech and behavior are normal.  ____________________________________________   LABS (all labs ordered are listed, but only abnormal results are displayed)  Labs Reviewed  URINALYSIS COMPLETEWITH MICROSCOPIC ([facility] ONLY) - Abnormal; Notable for the following:       Result Value   Color, Urine YELLOW (*)    APPearance CLEAR (*)    Protein, ur 30 (*)    All other components within normal limits  CBC WITH DIFFERENTIAL/PLATELET - Abnormal; Notable for the following:    WBC 15.9 (*)    Hemoglobin 11.9 (*)    HCT 36.0 (*)    MCV 79.3 (*)    Neutro Abs 13.1 (*)    All other components within normal limits  COMPREHENSIVE METABOLIC PANEL - Abnormal; Notable for the following:    Chloride 100 (*)    Glucose, Bld 121 (*)    Calcium 8.7 (*)    Albumin 3.3 (*)    All other components within normal limits  CULTURE, BLOOD (ROUTINE X 2)  CULTURE, BLOOD (ROUTINE X 2)  LIPASE, BLOOD   ____________________________________________  EKG  I personally interpreted any EKGs ordered by me or  triage  ____________________________________________  RADIOLOGY  I reviewed any imaging ordered by me or triage that were performed during my shift and, if possible, patient and/or family made aware of any abnormal findings. ____________________________________________   PROCEDURES  Procedure(s) performed: None  Procedures  Critical Care performed: None  ____________________________________________   INITIAL IMPRESSION / ASSESSMENT AND PLAN / ED COURSE  Pertinent labs & imaging results that were available during my care of the patient were reviewed by me and considered in my medical decision making (see chart for details).  Patient with subjective fevers and chills at home after surgery with ongoing equipment present. The patient's was discussed early on with surgery, who requested CT scan which has been performed. The surgeon has evaluated the CT, and they're in with the patient will make a determination about his disposition. No other obvious source of infection noted, patient is not here febrile nor is there evidence  of urinary tract infection nor is there evidence of pneumonia. Nothing to suggest PE or DVT. Patient does have some back pain with no evidence of cauda equina syndrome and no evidence of abscess. It is minimally tender to palpation back there.  ----------------------------------------- 12:35 AM on 06/18/2016 -----------------------------------------  Dr. Hildred LaserPisacoya saw and evaluated the patient personally several 1 emergency room. He feels the patient is safe for discharge. He does not wish the patient to be admitted. He does not wish IV antibiotics. He did wish the patient to go home on 875 twice a day of Augmentin for one week which we will provide. Extensive return precautions and follow-up given and understood. Patient very comfortable and has been quite well-appearing throughout his stay here.  Clinical Course    ____________________________________________   FINAL CLINICAL IMPRESSION(S) / ED DIAGNOSES  Final diagnoses:  None      This chart was dictated using voice recognition software.  Despite best efforts to proofread,  errors can occur which can change meaning.      Jeanmarie PlantJames A McShane, MD 06/18/16 [phone]    Jeanmarie PlantJames A McShane, MD 06/18/16 [phone]

## 2016-06-17 NOTE — ED Notes (Signed)
CT made aware that patient finished 2nd bottle of contrast.

## 2016-06-17 NOTE — ED Notes (Signed)
Pt complains of chills and sweats all days.

## 2016-06-17 NOTE — ED Triage Notes (Cosign Needed Addendum)
Pt had gallbladder removed 06/02/16.  Pt has fever, chills.  No abd pain.  No n/v/d.  Pt reports lower back pain from bending over last night.  Denies urinary sx.  Pt alert.

## 2016-06-17 NOTE — Telephone Encounter (Signed)
Patient was a no show for his HIDA scan. Spoke with Dr. Everlene FarrierPabon. He would like to get a RUQ Ultrasound, CBC, CMP prior to appointment.  US has been scheduled for patient on 06/20/16 at 0930 am. Patient will need to arrive at [facility] at [phone] and have nothing to eat after midnight.   Patient has been instructed to have labs drawn the same day and will follow-up as scheduled.

## 2016-06-17 NOTE — ED Notes (Signed)
MD at bedside. 

## 2016-06-18 DIAGNOSIS — R5082 Postprocedural fever: Secondary | ICD-10-CM | POA: Diagnosis not present

## 2016-06-18 MED ORDER — AMOXICILLIN-POT CLAVULANATE 875-125 MG PO TABS
1.0000 | ORAL_TABLET | Freq: Once | ORAL | Status: AC
  Administered 2016-06-18: 1 via ORAL
  Filled 2016-06-18: qty 1

## 2016-06-18 MED ORDER — AMOXICILLIN-POT CLAVULANATE 875-125 MG PO TABS: 1.0000 | ORAL_TABLET | Freq: Two times a day (BID) | ORAL | 0 refills | Status: DC

## 2016-06-18 NOTE — ED Notes (Signed)
MD at bedside. 

## 2016-06-18 NOTE — Consult Note (Signed)
Date of Consultation:  06/18/2016  Requesting Physician:  Ileana Roup, M.D.  Reason for Consultation:  Nausea and chills  History of Present Illness: Nathan Burch is a 45 y.o. male who is status post hand-assisted laparoscopic cholecystectomy on 9/28 with Dr. Everlene Farrier. He had a JP drain left in place. He was seen the office postoperatively on 10/5 and 10/11. Initially there was concern for potential bile leak as seen on his JP drain. However on his last visit appointment 2 days ago his JP drain was clearing and he reported no further abdominal pain as well as no nausea, fever, or chills. However he presents today to the emergency room because of one day history of chills as well as some nausea. Patient reports that last night he pulled his back although he is unsure as to how. Then today he's had some chills as well as some nausea although no emesis. He has not had any fevers at home but has had a cough. Otherwise no worsening abdominal pain, no chest pain, no shortness of breath, no diarrhea, no constipation.   Past Medical History: Past Medical History:  Diagnosis Date  . Hypertension      Past Surgical History: Past Surgical History:  Procedure Laterality Date  . CHOLECYSTECTOMY  06/02/2016   Procedure: LAPAROSCOPIC CHOLECYSTECTOMY;  Surgeon: Leafy Ro, MD;  Location: [facility] ORS;  Service: General;;  . NO PAST SURGERIES      Home Medications: Prior to Admission medications   Medication Sig Start Date End Date Taking? Authorizing Provider  amoxicillin-clavulanate (AUGMENTIN) 875-125 MG tablet Take 1 tablet by mouth 2 (two) times daily. 06/18/16 06/25/16  Jeanmarie Plant, MD  ibuprofen (ADVIL,MOTRIN) 200 MG tablet Take 200 mg by mouth every 6 (six) hours as needed.    Historical Provider, MD  Olmesartan-Amlodipine-HCTZ 40-10-25 MG TABS Take 1 tablet by mouth every morning.  12/09/15   Historical Provider, MD  oxyCODONE-acetaminophen (PERCOCET) 7.5-325 MG tablet Take 2 tablets by mouth  every 4 (four) hours as needed for moderate pain (May use 1 tab for mild pain). 06/05/16   Diego Ronnette Juniper, MD    Allergies: No Known Allergies  Social History:  reports that he has never smoked. He has never used smokeless tobacco. He reports that he does not drink alcohol or use drugs.   Family History: Family History  Problem Relation Age of Onset  . Heart disease Mother   . Diabetes Mother   . Hypertension Mother   . Heart disease Father   . Hypertension Father     Review of Systems: Review of Systems  Constitutional: Positive for chills. Negative for fever and malaise/fatigue.  Eyes: Negative for blurred vision.  Respiratory: Positive for cough. Negative for shortness of breath.   Cardiovascular: Negative for chest pain and palpitations.  Gastrointestinal: Positive for nausea. Negative for abdominal pain, constipation, diarrhea, heartburn and vomiting.  Genitourinary: Negative for dysuria and hematuria.  Musculoskeletal: Positive for back pain.  Neurological: Negative for dizziness and headaches.  Psychiatric/Behavioral: Negative for depression.    Physical Exam BP (!) 133/91 (BP Location: Right Arm)   Pulse 89   Temp 99.3 F (37.4 C) (Oral)   Resp 18   Ht 5\' 11"  (1.803 m)   Wt (!) 171.9 kg (379 lb)   SpO2 97%   BMI 52.86 kg/m  CONSTITUTIONAL: No acute distress HEENT:  Normocephalic, atraumatic, extraocular motion intact. NECK: Trachea is midline, and there is no jugular venous distension. Thyroid is without palpable abnormalities. LYMPH NODES:  Lymph nodes in the neck are not enlarged. RESPIRATORY:  Lungs are clear, and breath sounds are equal bilaterally. Normal respiratory effort without pathologic use of accessory muscles. CARDIOVASCULAR: Patient was initially tachycardic when he first presented to the emergency room but has since then settled down to the high 80s to low 90s GI: The abdomen is soft, obese, nondistended, nontender to palpation except for some  soreness around the JP drain site insertion point. There were no palpable masses. Incisions are clean dry and intact with no evidence of infection.. MUSCULOSKELETAL:  Normal muscle strength and tone in all four extremities.  No peripheral edema or cyanosis. SKIN: Skin turgor is normal. There are no pathologic skin lesions.  NEUROLOGIC:  Motor and sensation is grossly normal.  Cranial nerves are grossly intact. PSYCH:  Alert and oriented to person, place and time. Affect is normal.  Laboratory Analysis: Results for orders placed or performed during the hospital encounter of 06/17/16 (from the past 24 hour(s))  Urinalysis complete, with microscopic ([facility] only)     Status: Abnormal   Collection Time: 06/17/16  7:21 PM  Result Value Ref Range   Color, Urine YELLOW (A) YELLOW   APPearance CLEAR (A) CLEAR   Glucose, UA NEGATIVE NEGATIVE mg/dL   Bilirubin Urine NEGATIVE NEGATIVE   Ketones, ur NEGATIVE NEGATIVE mg/dL   Specific Gravity, Urine 1.019 1.005 - 1.030   Hgb urine dipstick NEGATIVE NEGATIVE   pH 6.0 5.0 - 8.0   Protein, ur 30 (A) NEGATIVE mg/dL   Nitrite NEGATIVE NEGATIVE   Leukocytes, UA NEGATIVE NEGATIVE   RBC / HPF 0-5 0 - 5 RBC/hpf   WBC, UA 0-5 0 - 5 WBC/hpf   Bacteria, UA NONE SEEN NONE SEEN   Squamous Epithelial / LPF NONE SEEN NONE SEEN   Mucous PRESENT    Hyaline Casts, UA PRESENT   Lipase, blood     Status: None   Collection Time: 06/17/16  7:21 PM  Result Value Ref Range   Lipase 24 11 - 51 U/L  CBC WITH DIFFERENTIAL     Status: Abnormal   Collection Time: 06/17/16  7:21 PM  Result Value Ref Range   WBC 15.9 (H) 3.8 - 10.6 K/uL   RBC 4.55 4.40 - 5.90 MIL/uL   Hemoglobin 11.9 (L) 13.0 - 18.0 g/dL   HCT 60.4 (L) 54.0 - 98.1 %   MCV 79.3 (L) 80.0 - 100.0 fL   MCH 26.1 26.0 - 34.0 pg   MCHC 33.0 32.0 - 36.0 g/dL   RDW 19.1 47.8 - 29.5 %   Platelets 348 150 - 440 K/uL   Neutrophils Relative % 83 %   Neutro Abs 13.1 (H) 1.4 - 6.5 K/uL   Lymphocytes Relative 10 %    Lymphs Abs 1.6 1.0 - 3.6 K/uL   Monocytes Relative 6 %   Monocytes Absolute 1.0 0.2 - 1.0 K/uL   Eosinophils Relative 1 %   Eosinophils Absolute 0.1 0 - 0.7 K/uL   Basophils Relative 0 %   Basophils Absolute 0.1 0 - 0.1 K/uL  Comprehensive metabolic panel     Status: Abnormal   Collection Time: 06/17/16  7:21 PM  Result Value Ref Range   Sodium 135 135 - 145 mmol/L   Potassium 3.6 3.5 - 5.1 mmol/L   Chloride 100 (L) 101 - 111 mmol/L   CO2 27 22 - 32 mmol/L   Glucose, Bld 121 (H) 65 - 99 mg/dL   BUN 11 6 - 20 mg/dL  Creatinine, Ser 0.89 0.61 - 1.24 mg/dL   Calcium 8.7 (L) 8.9 - 10.3 mg/dL   Total Protein 7.4 6.5 - 8.1 g/dL   Albumin 3.3 (L) 3.5 - 5.0 g/dL   AST 29 15 - 41 U/L   ALT 36 17 - 63 U/L   Alkaline Phosphatase 65 38 - 126 U/L   Total Bilirubin 0.7 0.3 - 1.2 mg/dL   GFR calc non Af Amer >60 >60 mL/min   GFR calc Af Amer >60 >60 mL/min   Anion gap 8 5 - 15    Imaging: Dg Chest 2 View  Result Date: 06/17/2016 CLINICAL DATA:  Acute onset of fever and chills. Lower back pain. Initial encounter. EXAM: CHEST  2 VIEW COMPARISON:  None. FINDINGS: The lungs are well-aerated. Minimal bibasilar atelectasis is noted. Mild vascular congestion is seen. There is no evidence of pleural effusion or pneumothorax. The heart is borderline normal in size. No acute osseous abnormalities are seen. A stent is noted at the upper abdomen. IMPRESSION: Minimal bibasilar atelectasis noted.  Mild vascular congestion seen. Electronically Signed   By: Roanna Raider M.D.   On: 06/17/2016 21:58   Ct Abdomen Pelvis W Contrast  Result Date: 06/17/2016 CLINICAL DATA:  45 y/o M; fever, chills, and lower back pain from bending last night. No abdominal pain. Status post cholecystectomy 9, 28, 17. EXAM: CT ABDOMEN AND PELVIS WITH CONTRAST TECHNIQUE: Multidetector CT imaging of the abdomen and pelvis was performed using the standard protocol following bolus administration of intravenous contrast. CONTRAST:   ISOVUE-300 IOPAMIDOL (ISOVUE-300) INJECTION 61% COMPARISON:  None. FINDINGS: Lower chest: No acute abnormality. Hepatobiliary: The liver is unremarkable. Status postcholecystectomy with a drain in the postsurgical bed. There is fat stranding throughout the gallbladder fossa extending to the abdominal wall and entry point of the surgical drain probably representing edema related to recent surgery. No discrete fluid collection is identified. No intrahepatic biliary ductal dilatation. Pancreas: Unremarkable. No pancreatic ductal dilatation or surrounding inflammatory changes. Spleen: Normal in size without focal abnormality. Adrenals/Urinary Tract: Normal adrenal glands. Left kidney upper pole subcentimeter cyst. Left kidney lower pole 4 mm nonobstructing caliceal stone. No hydronephrosis. Collapsed bladder. Stomach/Bowel: Stomach within normal limits. Normal appendix. No evidence for bowel obstruction. Mild thickening of the right upper quadrant transverse colon is probably reactive. Vascular/Lymphatic: No significant vascular findings are present. No enlarged abdominal or pelvic lymph nodes. Reproductive: Prostate is unremarkable. Other: No there is a ventral abdominal incision with edema and subcentimeter nonenhancing fluid collections probably representing postoperative seroma. There is a small fat containing paraumbilical hernia. Musculoskeletal: No acute or significant osseous findings. IMPRESSION: 1. Status post cholecystectomy with drain in the cholecystectomy bed. There are inflammatory changes throughout the gallbladder fossa extending to the anterior abdominal wall at the entrance point of the surgical drain which are probably related to recent surgery, although infection is not excluded. No discrete fluid collection or abscess identified. 2. Mild wall thickening of first and second segments of duodenum and transverse colon in region of gallbladder fossa inflammatory changes is likely reactive. 3. Left  kidney lower pole nonobstructing stone. 4. Ventral abdominal incision with subcentimeter seroma and mild inflammatory changes, also likely postoperative. Electronically Signed   By: Mitzi Hansen M.D.   On: 06/17/2016 23:47    Assessment and Plan: This is a 45 y.o. male who presents with nausea and chills now 2 weeks out status post hand-assisted laparoscopic cholecystectomy. I have personally reviewed all of the patient's laboratory studies and imaging  studies and have explained them to the patient as well. He does have a white blood cell count of 15 but his chest x-ray is negative for pneumonia, urinalysis is negative, and his CT scan does not show any abscess or fluid collection. There are only postoperative changes around the right upper quadrant in the gallbladder fossa. I had a discussion with the patient with potential options for bringing him in for observation versus potentially discharging him to home with a follow-up appointment this coming week with Dr. Everlene FarrierPabon. Currently the patient is afebrile with a normal heart rate, no further chills, and no further nausea. The patient has elected to go home. He will be given a prescription for Augmentin for a one-week course prophylactically. With regards to his back pain he has been instructed that he can take ibuprofen as needed and the patient reports that he still has a few oxycodone from his discharge medications after his surgery. He has also been advised to come back if he were to have any fevers greater than 101.5, worsening chills, worsening abdominal pain, nausea, vomiting, changes with the drain, or other concerns. He understands this plan and instructions and all of his questions have been answered.   Howie IllJose Luis Rumeal Cullipher, MD [facility]

## 2016-06-18 NOTE — Discharge Instructions (Signed)
Return to the emergency room for any new or worrisome symptoms including fever, weakness, vomiting, pain with urination shortness of breath, abdominal pain, redness to skin, or if you feel worse in any way. Follow up with your surgeons as scheduled on Tuesday. ,

## 2016-06-20 ENCOUNTER — Ambulatory Visit: Payer: BC Managed Care – PPO

## 2016-06-20 ENCOUNTER — Telehealth: Payer: Self-pay | Admitting: Surgery

## 2016-06-20 NOTE — Telephone Encounter (Signed)
Patient was not made aware of appointment for previous ultrasound by myself so this had to be rescheduled. Radiology scheduling and radiology front desk has been made aware of the circumstances so that the patient is not charged a fee for [employer].  Ultrasound was rescheduled for tomorrow morning [facility] at 0900. Patient is to arrive at 0845am. Nothing to eat or drink after midnight.  Call made to patient at this time. No answer. Unable to leave voicemail.  Call made to Emergency Contact, [place]. No answer. Left voicemail asking for return phone call from patient.

## 2016-06-20 NOTE — Telephone Encounter (Signed)
Patient would like for you to call him. He doesn't understand why he needs the Ultrasound.

## 2016-06-20 NOTE — Telephone Encounter (Signed)
Spoke to patient and informed him about his Ultrasound appointment and that we moved his office appointment back to the afternoon. He understood.

## 2016-06-20 NOTE — Telephone Encounter (Signed)
Patient has been advised of his [facility]. Arrive at 8:45am, nothing to eat or drink after midnight.

## 2016-06-21 ENCOUNTER — Encounter: Payer: BC Managed Care – PPO | Admitting: Surgery

## 2016-06-21 ENCOUNTER — Ambulatory Visit
Admission: RE | Admit: 2016-06-21 | Discharge: 2016-06-21 | Disposition: A | Discharge status: Home / Self Care | Payer: BC Managed Care – PPO | Source: Ambulatory Visit | Attending: Surgery | Admitting: Surgery

## 2016-06-21 ENCOUNTER — Other Ambulatory Visit: Payer: Self-pay | Admitting: Surgery

## 2016-06-21 DIAGNOSIS — Z9049 Acquired absence of other specified parts of digestive tract: Secondary | ICD-10-CM | POA: Diagnosis not present

## 2016-06-21 DIAGNOSIS — D72829 Elevated white blood cell count, unspecified: Secondary | ICD-10-CM | POA: Insufficient documentation

## 2016-06-21 DIAGNOSIS — K8000 Calculus of gallbladder with acute cholecystitis without obstruction: Secondary | ICD-10-CM

## 2016-06-21 DIAGNOSIS — R932 Abnormal findings on diagnostic imaging of liver and biliary tract: Secondary | ICD-10-CM | POA: Diagnosis not present

## 2016-06-21 NOTE — Telephone Encounter (Signed)
Results reviewed. This will be reviewed with patient at appointment tomorrow by Dr. Everlene FarrierPabon.

## 2016-06-22 ENCOUNTER — Ambulatory Visit (INDEPENDENT_AMBULATORY_CARE_PROVIDER_SITE_OTHER): Payer: BC Managed Care – PPO | Admitting: Surgery

## 2016-06-22 ENCOUNTER — Encounter: Payer: Self-pay | Admitting: Surgery

## 2016-06-22 VITALS — BP 134/91 | HR 76 | Temp 99.0°F | Wt 364.0 lb

## 2016-06-22 DIAGNOSIS — R1084 Generalized abdominal pain: Secondary | ICD-10-CM

## 2016-06-22 DIAGNOSIS — Z09 Encounter for follow-up examination after completed treatment for conditions other than malignant neoplasm: Secondary | ICD-10-CM

## 2016-06-22 LAB — CULTURE, BLOOD (ROUTINE X 2)
Culture: NO GROWTH
Culture: NO GROWTH

## 2016-06-22 MED ORDER — CIPROFLOXACIN HCL 500 MG PO TABS: 500.0000 mg | ORAL_TABLET | Freq: Two times a day (BID) | ORAL | 0 refills | Status: DC

## 2016-06-22 MED ORDER — METRONIDAZOLE 500 MG PO TABS: 500.0000 mg | ORAL_TABLET | Freq: Three times a day (TID) | ORAL | 0 refills | Status: DC

## 2016-06-22 NOTE — Progress Notes (Signed)
S/p HALS chole Developed a very small leak that was treated by leaving the JP in Recently went to the ER, CT no collections of abnormalities, was given a Augmentin but he has not tolerated this well Afebrile, Taking PO, ambulating Bili is nml Repeat U/S reviewed, no collections or biloma JP about 10-15 cc day but now is purulent drainage AVSS  PE: NAD, walking w/o issues Abd: soft, NT, incisions healing well no infection. JP some purulent drainage. No peritonitis  A/P developed an abscess that is draining via the current JP Switch to cipro and flagyl RTC w Dr. Orvis BrillLoflin next week, reassess JP, if clear may remove it No need for ERCP or any further surgical interventions

## 2016-06-22 NOTE — Patient Instructions (Addendum)
Please go to the lab and have your blood drawn before you come to the office.  Please start taking your antibiotics and finish them.  We will see you next week and hopefully we could remove your drain.

## 2016-06-23 ENCOUNTER — Telehealth: Payer: Self-pay | Admitting: Surgery

## 2016-06-23 NOTE — Telephone Encounter (Signed)
Returned phone call to this patient. No answer. Unable to leave message. Will try again later.

## 2016-06-23 NOTE — Telephone Encounter (Signed)
Patient left a voice message and has a question regarding his antibiotic

## 2016-06-23 NOTE — Telephone Encounter (Signed)
Called patient once again at this time. Spoke with patient. He wanted to make sure that he was supposed to take both antibiotics or just one at a time. I informed patient that he would need to take both medications at the same time. He verbalizes understanding.

## 2016-06-28 ENCOUNTER — Other Ambulatory Visit
Admission: RE | Admit: 2016-06-28 | Discharge: 2016-06-28 | Disposition: A | Discharge status: Home / Self Care | Payer: BC Managed Care – PPO | Source: Ambulatory Visit | Attending: Surgery | Admitting: Surgery

## 2016-06-28 ENCOUNTER — Telehealth: Payer: Self-pay

## 2016-06-28 ENCOUNTER — Encounter: Payer: Self-pay | Admitting: Surgery

## 2016-06-28 ENCOUNTER — Ambulatory Visit (INDEPENDENT_AMBULATORY_CARE_PROVIDER_SITE_OTHER): Payer: BC Managed Care – PPO | Admitting: Surgery

## 2016-06-28 VITALS — BP 137/70 | HR 86 | Temp 98.4°F | Ht 71.0 in | Wt 364.0 lb

## 2016-06-28 DIAGNOSIS — K8000 Calculus of gallbladder with acute cholecystitis without obstruction: Secondary | ICD-10-CM

## 2016-06-28 DIAGNOSIS — R1084 Generalized abdominal pain: Secondary | ICD-10-CM | POA: Diagnosis present

## 2016-06-28 LAB — COMPREHENSIVE METABOLIC PANEL
ALBUMIN: 3.3 g/dL — AB (ref 3.5–5.0)
ALK PHOS: 60 U/L (ref 38–126)
ALT: 39 U/L (ref 17–63)
ANION GAP: 7 (ref 5–15)
AST: 38 U/L (ref 15–41)
BUN: 11 mg/dL (ref 6–20)
CALCIUM: 9.1 mg/dL (ref 8.9–10.3)
CO2: 27 mmol/L (ref 22–32)
Chloride: 101 mmol/L (ref 101–111)
Creatinine, Ser: 1.09 mg/dL (ref 0.61–1.24)
GFR calc Af Amer: >60 mL/min (ref >60)
GFR calc non Af Amer: >60 mL/min (ref >60)
GLUCOSE: 99 mg/dL (ref 65–99)
Potassium: 3.9 mmol/L (ref 3.5–5.1)
SODIUM: 135 mmol/L (ref 135–145)
Total Bilirubin: 0.5 mg/dL (ref 0.3–1.2)
Total Protein: 7.7 g/dL (ref 6.5–8.1)

## 2016-06-28 LAB — CBC WITH DIFFERENTIAL/PLATELET
BASOS ABS: 0 10*3/uL (ref 0–0.1)
BASOS PCT: 1 %
EOS ABS: 0.2 10*3/uL (ref 0–0.7)
Eosinophils Relative: 4 %
HCT: 36.2 % — ABNORMAL LOW (ref 40.0–52.0)
HEMOGLOBIN: 11.7 g/dL — AB (ref 13.0–18.0)
Lymphocytes Relative: 28 %
Lymphs Abs: 1.7 10*3/uL (ref 1.0–3.6)
MCH: 25.4 pg — ABNORMAL LOW (ref 26.0–34.0)
MCHC: 32.3 g/dL (ref 32.0–36.0)
MCV: 78.7 fL — ABNORMAL LOW (ref 80.0–100.0)
Monocytes Absolute: 0.5 10*3/uL (ref 0.2–1.0)
Monocytes Relative: 9 %
NEUTROS PCT: 58 %
Neutro Abs: 3.4 10*3/uL (ref 1.4–6.5)
Platelets: 316 10*3/uL (ref 150–440)
RBC: 4.6 MIL/uL (ref 4.40–5.90)
RDW: 14.4 % (ref 11.5–14.5)
WBC: 5.9 10*3/uL (ref 3.8–10.6)

## 2016-06-28 NOTE — Patient Instructions (Signed)
Please buy an abdominal binder to see if this will help with your abdomen while working.  Please give us a call if you have any questions or concerns.

## 2016-06-28 NOTE — Telephone Encounter (Signed)
FMLA form was filled out and [facility] [employer].

## 2016-06-28 NOTE — Progress Notes (Signed)
Outpatient Surgical Follow Up  06/28/2016  Nathan Burch is an 45 y.o. male seen for the diagnosis of Calculus of gallbladder with acute cholecystitis without obstruction [K80.00].  HPI: Patient seen and examined in clinic. He had a laparoscopic cholecystectomy with a JP drain placement on 9/28 with Dr. Dahlia Byes. Overall he is doing well with no acute complaints. he denies N/V, Constpation, fevers or malaise. Reports compliance with post-op instructions and has been doing wound care appropriately. He has had some diarrhea which she attributes more to the antibiotics. He has still had some drainage out of the JP drain which is been about 10-15 mL per day.  Past Medical History:  Diagnosis Date  . Hypertension     Past Surgical History:  Procedure Laterality Date  . CHOLECYSTECTOMY  06/02/2016   Procedure: LAPAROSCOPIC CHOLECYSTECTOMY;  Surgeon: Jules Husbands, MD;  Location: [facility] ORS;  Service: General;;  . NO PAST SURGERIES      Family History  Problem Relation Age of Onset  . Heart disease Mother   . Diabetes Mother   . Hypertension Mother   . Heart disease Father   . Hypertension Father     Social History:  reports that he has never smoked. He has never used smokeless tobacco. He reports that he does not drink alcohol or use drugs.  Allergies: No Known Allergies  Medications reviewed.  Physical Exam:  BP 137/70   Pulse 86   Temp 98.4 F (36.9 C) (Oral)   Ht 5' 11" (1.803 m)   Wt (!) 364 lb (165.1 kg)   BMI 50.77 kg/m   Gen: patient resting comfortably in clinic, no cardiovascular or respiratory distress Abd/GI: Obese incisions clean dry and intact no erythema or drainage, JP drain out of middle subcostal incision site no erythema or drainage but still with purulence coming from the drain  Results for orders placed or performed during the hospital encounter of 06/28/16 (from the past 48 hour(s))  CBC with Differential     Status: Abnormal   Collection Time: 06/28/16   8:24 AM  Result Value Ref Range   WBC 5.9 3.8 - 10.6 K/uL   RBC 4.60 4.40 - 5.90 MIL/uL   Hemoglobin 11.7 (L) 13.0 - 18.0 g/dL   HCT 36.2 (L) 40.0 - 52.0 %   MCV 78.7 (L) 80.0 - 100.0 fL   MCH 25.4 (L) 26.0 - 34.0 pg   MCHC 32.3 32.0 - 36.0 g/dL   RDW 14.4 11.5 - 14.5 %   Platelets 316 150 - 440 K/uL   Neutrophils Relative % 58 %   Neutro Abs 3.4 1.4 - 6.5 K/uL   Lymphocytes Relative 28 %   Lymphs Abs 1.7 1.0 - 3.6 K/uL   Monocytes Relative 9 %   Monocytes Absolute 0.5 0.2 - 1.0 K/uL   Eosinophils Relative 4 %   Eosinophils Absolute 0.2 0 - 0.7 K/uL   Basophils Relative 1 %   Basophils Absolute 0.0 0 - 0.1 K/uL  Comprehensive metabolic panel     Status: Abnormal   Collection Time: 06/28/16  8:24 AM  Result Value Ref Range   Sodium 135 135 - 145 mmol/L   Potassium 3.9 3.5 - 5.1 mmol/L   Chloride 101 101 - 111 mmol/L   CO2 27 22 - 32 mmol/L   Glucose, Bld 99 65 - 99 mg/dL   BUN 11 6 - 20 mg/dL   Creatinine, Ser 1.09 0.61 - 1.24 mg/dL   Calcium 9.1 8.9 -  10.3 mg/dL   Total Protein 7.7 6.5 - 8.1 g/dL   Albumin 3.3 (L) 3.5 - 5.0 g/dL   AST 38 15 - 41 U/L   ALT 39 17 - 63 U/L   Alkaline Phosphatase 60 38 - 126 U/L   Total Bilirubin 0.5 0.3 - 1.2 mg/dL   GFR calc non Af Amer >60 >60 mL/min   GFR calc Af Amer >60 >60 mL/min    Comment: (NOTE) The eGFR has been calculated using the CKD EPI equation. This calculation has not been validated in all clinical situations. eGFR's persistently <60 mL/min signify possible Chronic Kidney Disease.    Anion gap 7 5 - 15   No results found.  Assessment/Plan: Nathan Burch is an 45 y.o. male seen for the diagnosis of Calculus of gallbladder with acute cholecystitis without obstruction [K80.00]. Progressing as expected.  Will plan on the following: - Still with purulence from the drain and about 15 mL a day, will continue for 1 more week - Complete antibiotics and will follow-up next week with Dr. Hampton Abbot --Instructed to wear  abdominal binder at work  [place]. Savannah Morford MD General Surgeon  06/28/2016,12:35 PM

## 2016-07-06 ENCOUNTER — Ambulatory Visit (INDEPENDENT_AMBULATORY_CARE_PROVIDER_SITE_OTHER): Payer: BC Managed Care – PPO | Admitting: Surgery

## 2016-07-06 ENCOUNTER — Encounter: Payer: Self-pay | Admitting: Surgery

## 2016-07-06 VITALS — BP 154/96 | HR 93 | Temp 98.6°F | Ht 71.0 in | Wt 366.0 lb

## 2016-07-06 DIAGNOSIS — Z9049 Acquired absence of other specified parts of digestive tract: Secondary | ICD-10-CM | POA: Insufficient documentation

## 2016-07-06 DIAGNOSIS — K810 Acute cholecystitis: Secondary | ICD-10-CM

## 2016-07-06 NOTE — Progress Notes (Signed)
07/06/2016  HPI: Patient is status post laparoscopic cholecystectomy on 9/28 with Dr. Everlene FarrierPabon. He's been seen in the office multiple times due to biliary leak that was noted on his JP drain. Last week he was noted to have purulent drainage from the drain and was continued on a course of antibiotics. Since then he reports that the fluid has become thinner instill only drains a few cc per day. Patient denies any fevers, chills, nausea, vomiting. Reports that he's been doing exercise and loosing so far 30 pounds since his surgery.   Vital signs: BP (!) 154/96 (BP Location: Left Arm, Patient Position: Sitting)   Pulse 93   Temp 98.6 F (37 C) (Oral)   Ht 5\' 11"  (1.803 m)   Wt (!) 166 kg (366 lb)   BMI 51.05 kg/m    Physical Exam: Constitutional: No acute distress Abdomen: Soft, nondistended, nontender to palpation. Patient's incisions have been healing well with no evidence of erythema or drainage. The JP drain has minimal amount of thin fluid with no foul odor.  Assessment/Plan: 45 year old male status post laparoscopic cholecystectomy.  -We'll remove JP drain today as a fluid has become much thinner and low volume. Patient has remained asymptomatic after finishing his antibiotic course. -Patient will follow-up with Dr. Everlene FarrierPabon next week for final check. Patient has been instructed regarding signs and symptoms to look out for particularly fevers, chills, worsening abdominal pain or drainage from the incision.   Nathan IllJose Luis Finneus Kaneshiro, MD [facility]

## 2016-07-06 NOTE — Patient Instructions (Signed)
Your follow up appointment is listed below  Please call our office with any questions or concerns.  Please do not submerge in a tub, hot tub, or pool until incisions are completely sealed.  Use sun block to incision area over the next year if this area will be exposed to sun. This helps decrease scarring.  You may now resume your normal activities. Listen to your body when lifting, if you have pain when lifting, stop and then try again in a few days.  If you develop redness, drainage, or pain at incision sites. Severe abdominal pain, persistent nausea and vomiting, decreased appetite, or fever greater than 100.5, call our office immediately and speak with a nurse.

## 2016-07-14 ENCOUNTER — Ambulatory Visit (INDEPENDENT_AMBULATORY_CARE_PROVIDER_SITE_OTHER): Payer: BC Managed Care – PPO | Admitting: Surgery

## 2016-07-14 ENCOUNTER — Encounter: Payer: Self-pay | Admitting: Surgery

## 2016-07-14 VITALS — BP 134/89 | HR 90 | Temp 98.7°F | Ht 71.0 in | Wt 367.5 lb

## 2016-07-14 DIAGNOSIS — Z09 Encounter for follow-up examination after completed treatment for conditions other than malignant neoplasm: Secondary | ICD-10-CM

## 2016-07-14 NOTE — Progress Notes (Signed)
S/p HALS chole for gangrenous cholecystitis. Developed and small bile leak that was controlled with a JP drain subsequently developed a small abscesses that were treated with antibiotics. His Tapia drain has been removed by my partner Dr. Aleen CampiPiscoya. He has continued to spread improve no evidence of biliary obstruction or cholangitis. No fevers no chills. Operative antibiotics. Taking by mouth  PE NAD Abd: soft, NT, incisions well healed, no infection , no peritonitis.  A/P Doing well bile leak and abscess resolved No surgical issues at this time RTC prn

## 2016-07-14 NOTE — Patient Instructions (Signed)
Please call with any questions or concerns.

## 2019-12-31 ENCOUNTER — Emergency Department: Payer: BC Managed Care – PPO

## 2019-12-31 ENCOUNTER — Encounter: Payer: Self-pay | Admitting: Emergency Medicine

## 2019-12-31 ENCOUNTER — Other Ambulatory Visit: Payer: Self-pay

## 2019-12-31 ENCOUNTER — Emergency Department
Admission: EM | Admit: 2019-12-31 | Discharge: 2019-12-31 | Disposition: A | Discharge status: Home / Self Care | Payer: BC Managed Care – PPO | Attending: Emergency Medicine | Admitting: Emergency Medicine

## 2019-12-31 DIAGNOSIS — G510 Bell's palsy: Secondary | ICD-10-CM | POA: Diagnosis not present

## 2019-12-31 DIAGNOSIS — Z79899 Other long term (current) drug therapy: Secondary | ICD-10-CM | POA: Insufficient documentation

## 2019-12-31 DIAGNOSIS — R791 Abnormal coagulation profile: Secondary | ICD-10-CM | POA: Insufficient documentation

## 2019-12-31 DIAGNOSIS — I10 Essential (primary) hypertension: Secondary | ICD-10-CM | POA: Insufficient documentation

## 2019-12-31 DIAGNOSIS — R29810 Facial weakness: Secondary | ICD-10-CM | POA: Diagnosis present

## 2019-12-31 LAB — CBC
HCT: 43 % (ref 39.0–52.0)
Hemoglobin: 13.4 g/dL (ref 13.0–17.0)
MCH: 26 pg (ref 26.0–34.0)
MCHC: 31.2 g/dL (ref 30.0–36.0)
MCV: 83.5 fL (ref 80.0–100.0)
Platelets: 219 10*3/uL (ref 150–400)
RBC: 5.15 MIL/uL (ref 4.22–5.81)
RDW: 14.1 % (ref 11.5–15.5)
WBC: 4.7 10*3/uL (ref 4.0–10.5)
nRBC: 0 % (ref 0.0–0.2)

## 2019-12-31 LAB — COMPREHENSIVE METABOLIC PANEL
ALT: 23 U/L (ref 0–44)
AST: 23 U/L (ref 15–41)
Albumin: 3.6 g/dL (ref 3.5–5.0)
Alkaline Phosphatase: 60 U/L (ref 38–126)
Anion gap: 7 (ref 5–15)
BUN: 16 mg/dL (ref 6–20)
CO2: 29 mmol/L (ref 22–32)
Calcium: 8.8 mg/dL — ABNORMAL LOW (ref 8.9–10.3)
Chloride: 103 mmol/L (ref 98–111)
Creatinine, Ser: 1 mg/dL (ref 0.61–1.24)
GFR calc Af Amer: >60 mL/min (ref >60)
GFR calc non Af Amer: >60 mL/min (ref >60)
Glucose, Bld: 97 mg/dL (ref 70–99)
Potassium: 3.7 mmol/L (ref 3.5–5.1)
Sodium: 139 mmol/L (ref 135–145)
Total Bilirubin: 0.6 mg/dL (ref 0.3–1.2)
Total Protein: 7.5 g/dL (ref 6.5–8.1)

## 2019-12-31 LAB — DIFFERENTIAL
Abs Immature Granulocytes: 0.01 10*3/uL (ref 0.00–0.07)
Basophils Absolute: 0 10*3/uL (ref 0.0–0.1)
Basophils Relative: 0 %
Eosinophils Absolute: 0.1 10*3/uL (ref 0.0–0.5)
Eosinophils Relative: 2 %
Immature Granulocytes: 0 %
Lymphocytes Relative: 34 %
Lymphs Abs: 1.6 10*3/uL (ref 0.7–4.0)
Monocytes Absolute: 0.5 10*3/uL (ref 0.1–1.0)
Monocytes Relative: 10 %
Neutro Abs: 2.5 10*3/uL (ref 1.7–7.7)
Neutrophils Relative %: 54 %

## 2019-12-31 LAB — PROTIME-INR
INR: 0.9 (ref 0.8–1.2)
Prothrombin Time: 12.5 seconds (ref 11.4–15.2)

## 2019-12-31 LAB — APTT: aPTT: 33 seconds (ref 24–36)

## 2019-12-31 MED ORDER — LORAZEPAM 2 MG/ML IJ SOLN: 1.0000 mg | Freq: Once | INTRAMUSCULAR | Status: DC

## 2019-12-31 MED ORDER — PREDNISONE 20 MG PO TABS: 60.0000 mg | ORAL_TABLET | Freq: Every day | ORAL | 0 refills | Status: DC

## 2019-12-31 MED ORDER — VALACYCLOVIR HCL 1 G PO TABS
1000.0000 mg | ORAL_TABLET | Freq: Three times a day (TID) | ORAL | 0 refills | Status: AC
Start: 2019-12-31 — End: 2020-01-07

## 2019-12-31 MED ORDER — LORAZEPAM 2 MG/ML IJ SOLN
INTRAMUSCULAR | Status: AC
  Filled 2019-12-31: qty 1

## 2019-12-31 NOTE — ED Notes (Signed)
Awaiting MRI

## 2019-12-31 NOTE — ED Triage Notes (Signed)
Says yesterday noticed numbness on right eye/cheek area.  This am noticed right side facial droop.  He does not have any weakness or numbness in extremities.

## 2019-12-31 NOTE — ED Notes (Signed)
Pt taken to MRI  

## 2019-12-31 NOTE — ED Provider Notes (Signed)
Bell's palsy [facility] Emergency Department Provider Note  Time seen: 8:34 AM  I have reviewed the triage vital signs and the nursing notes.   HISTORY  Chief Complaint Numbness   HPI Nathan Burch is a 49 y.o. male with a past medical history of obesity, hyperlipidemia, presents to the emergency department for right facial numbness and droop.  According to the patient yesterday he began feeling a tingling and numbness sensation to his right face.  States this morning he awoke with a right facial droop.  Patient does state a history of Bell's palsy approximately 20 years ago which he believes was on the right side but states it completely resolved after taking steroids.  Denies any weakness or numbness of any arm or leg confusion slurred speech or headache.   Past Medical History:  Diagnosis Date  . Hypertension     Patient Active Problem List   Diagnosis Date Noted  . S/P laparoscopic cholecystectomy 07/06/2016  . Postoperative fever   . Prediabetes 03/29/2015  . Morbid obesity with BMI of 50.0-59.9, adult ([place]) 12/05/2013  . Idiopathic hypertension 12/14/2012  . Hyperlipidemia 12/14/2012    Past Surgical History:  Procedure Laterality Date  . CHOLECYSTECTOMY  06/02/2016   Procedure: LAPAROSCOPIC CHOLECYSTECTOMY;  Surgeon: Jules Husbands, MD;  Location: [facility] ORS;  Service: General;;  . NO PAST SURGERIES      Prior to Admission medications   Medication Sig Start Date End Date Taking? Authorizing Provider  Olmesartan-Amlodipine-HCTZ 40-10-25 MG TABS Take 1 tablet by mouth every morning.  12/09/15   [provider]    No Known Allergies  Family History  Problem Relation Age of Onset  . Heart disease Mother   . Diabetes Mother   . Hypertension Mother   . Heart disease Father   . Hypertension Father     Social History Social History   Tobacco Use  . Smoking status: Never Smoker  . Smokeless tobacco: Never Used  Substance Use  Topics  . Alcohol use: No  . Drug use: No    Review of Systems Constitutional: Negative for fever. Cardiovascular: Negative for chest pain. Respiratory: Negative for shortness of breath. Gastrointestinal: Negative for abdominal pain, vomiting  Musculoskeletal: Negative for musculoskeletal complaints Neurological: Right facial droop/numbness. All other ROS negative  ____________________________________________   PHYSICAL EXAM:  VITAL SIGNS: ED Triage Vitals [12/31/19 0747]  Enc Vitals Group     BP (!) 146/91     Pulse Rate 80     Resp 16     Temp 98.2 F (36.8 C)     Temp Source Oral     SpO2 97 %     Weight (!) 378 lb (171.5 kg)     Height 5\' 11"  (1.803 m)     Head Circumference      Peak Flow      Pain Score 0     Pain Loc      Pain Edu?      Excl. in [place]?     Constitutional: Alert and oriented. Well appearing and in no distress. Eyes: Normal exam ENT      Head: Normocephalic and atraumatic.      Mouth/Throat: Mucous membranes are moist. Cardiovascular: Normal rate, regular rhythm.  Respiratory: Normal respiratory effort without tachypnea nor retractions. Breath sounds are clear  Gastrointestinal: Soft and nontender. No distention.   Musculoskeletal: Nontender with normal range of motion in all extremities.  Neurologic:  Normal speech and language.  Patient  does have a moderate right facial droop.  Patient states feeling of numbness to the right face however on sensory testing patient states it feels equal. Skin:  Skin is warm, dry.  No rash in or around the ears. Psychiatric: Mood and affect are normal.  ____________________________________________    EKG  EKG viewed and interpreted by myself shows a normal sinus rhythm at 65 bpm with a narrow QRS, normal axis, normal intervals, no concerning ST changes.  ____________________________________________    RADIOLOGY  CT negative. MRI negative  ____________________________________________   INITIAL  IMPRESSION / ASSESSMENT AND PLAN / ED COURSE  Pertinent labs & imaging results that were available during my care of the patient were reviewed by me and considered in my medical decision making (see chart for details).   Patient presents to the emergency department for right facial droop and perceived numbness to the right side.  Patient does have a moderate droop on examination.  Remainder of the neurological exam is normal.  Patient states sensation of numbness but states equal sensation during sensory exam.  Differential at this time would include Bell's palsy versus CVA.  I reviewed the patient's CT images I do not see any sign of acute CVA bleed tumor or mass on CT images.  Currently awaiting radiology read.  We will proceed with MRI of the brain to rule out CVA.  Patient agreeable to plan of care.  Patient MRI is resulted negative.  Highly suspect Bell's palsy.  We will start the patient on a 1 week course of prednisone as well as 1 week of valacyclovir.  Nathan Burch was evaluated in Emergency Department on 12/31/2019 for the symptoms described in the history of present illness. He was evaluated in the context of the global COVID-19 pandemic, which necessitated consideration that the patient might be at risk for infection with the SARS-CoV-2 virus that causes COVID-19. Institutional protocols and algorithms that pertain to the evaluation of patients at risk for COVID-19 are in a state of rapid change based on information released by regulatory bodies including the CDC and federal and state organizations. These policies and algorithms were followed during the patient's care in the ED.  ____________________________________________   FINAL CLINICAL IMPRESSION(S) / ED DIAGNOSES  Right facial droop   Minna Antis, MD 12/31/19 1055

## 2019-12-31 NOTE — ED Notes (Signed)
MRI called requesting anxiety medication due to patient having anxiety in MRI machine. orders received by EDP. MRI then called and states that pt tolerating at this time and they will call if medication needed.

## 2019-12-31 NOTE — ED Notes (Signed)
Pt alert and oriented X 4, stable for discharge. RR even and unlabored, color WNL. Discussed discharge instructions and follow up when appropriate. Instructed to follow up with ER for any life threatening symptoms or concerns that patient or family of patient may have  

## 2020-01-15 ENCOUNTER — Emergency Department: Payer: BC Managed Care – PPO

## 2020-01-15 ENCOUNTER — Emergency Department
Admission: EM | Admit: 2020-01-15 | Discharge: 2020-01-15 | Disposition: A | Discharge status: Home / Self Care | Payer: BC Managed Care – PPO | Attending: Student in an Organized Health Care Education/Training Program | Admitting: Student in an Organized Health Care Education/Training Program

## 2020-01-15 ENCOUNTER — Other Ambulatory Visit: Payer: Self-pay

## 2020-01-15 DIAGNOSIS — I10 Essential (primary) hypertension: Secondary | ICD-10-CM | POA: Diagnosis not present

## 2020-01-15 DIAGNOSIS — Z79899 Other long term (current) drug therapy: Secondary | ICD-10-CM | POA: Diagnosis not present

## 2020-01-15 DIAGNOSIS — M25562 Pain in left knee: Secondary | ICD-10-CM | POA: Insufficient documentation

## 2020-01-15 DIAGNOSIS — R2242 Localized swelling, mass and lump, left lower limb: Secondary | ICD-10-CM | POA: Diagnosis not present

## 2020-01-15 LAB — CBC WITH DIFFERENTIAL/PLATELET
Abs Immature Granulocytes: 0.02 10*3/uL (ref 0.00–0.07)
Basophils Absolute: 0 10*3/uL (ref 0.0–0.1)
Basophils Relative: 1 %
Eosinophils Absolute: 0.1 10*3/uL (ref 0.0–0.5)
Eosinophils Relative: 2 %
HCT: 40.3 % (ref 39.0–52.0)
Hemoglobin: 13.2 g/dL (ref 13.0–17.0)
Immature Granulocytes: 0 %
Lymphocytes Relative: 31 %
Lymphs Abs: 2.2 10*3/uL (ref 0.7–4.0)
MCH: 26.6 pg (ref 26.0–34.0)
MCHC: 32.8 g/dL (ref 30.0–36.0)
MCV: 81.1 fL (ref 80.0–100.0)
Monocytes Absolute: 0.6 10*3/uL (ref 0.1–1.0)
Monocytes Relative: 8 %
Neutro Abs: 4.1 10*3/uL (ref 1.7–7.7)
Neutrophils Relative %: 58 %
Platelets: 223 10*3/uL (ref 150–400)
RBC: 4.97 MIL/uL (ref 4.22–5.81)
RDW: 14.8 % (ref 11.5–15.5)
WBC: 7.1 10*3/uL (ref 4.0–10.5)
nRBC: 0 % (ref 0.0–0.2)

## 2020-01-15 LAB — COMPREHENSIVE METABOLIC PANEL
ALT: 20 U/L (ref 0–44)
AST: 18 U/L (ref 15–41)
Albumin: 3.4 g/dL — ABNORMAL LOW (ref 3.5–5.0)
Alkaline Phosphatase: 54 U/L (ref 38–126)
Anion gap: 8 (ref 5–15)
BUN: 15 mg/dL (ref 6–20)
CO2: 27 mmol/L (ref 22–32)
Calcium: 8.7 mg/dL — ABNORMAL LOW (ref 8.9–10.3)
Chloride: 102 mmol/L (ref 98–111)
Creatinine, Ser: 1.01 mg/dL (ref 0.61–1.24)
GFR calc Af Amer: >60 mL/min (ref >60)
GFR calc non Af Amer: >60 mL/min (ref >60)
Glucose, Bld: 99 mg/dL (ref 70–99)
Potassium: 3.9 mmol/L (ref 3.5–5.1)
Sodium: 137 mmol/L (ref 135–145)
Total Bilirubin: 0.6 mg/dL (ref 0.3–1.2)
Total Protein: 6.7 g/dL (ref 6.5–8.1)

## 2020-01-15 MED ORDER — MELOXICAM 15 MG PO TABS: 15.0000 mg | ORAL_TABLET | Freq: Every day | ORAL | 1 refills | Status: AC

## 2020-01-15 NOTE — ED Triage Notes (Signed)
Pt comes via POV from home with c/o left leg pain. Pt states pain in left leg. Pt states no recent injuries.  Pt states he did have a long trip recently and his leg felt stiff. Pt states soreness.

## 2020-01-15 NOTE — ED Provider Notes (Signed)
Emergency Department Provider Note  ____________________________________________  Time seen: Approximately 6:19 PM  I have reviewed the triage vital signs and the nursing notes.   HISTORY  Chief Complaint Leg Pain   Historian Patient     HPI Nathan Burch is a 49 y.o. male with a history of hypertension, presents to the emergency department with posterior left knee pain and swelling of the left lower extremity.  Patient states that he has had symptoms for the past 3 to 4 days.  He did recently have a long car ride and has a history of obesity.  He denies daily smoking or recent surgery.  Denies chest pain, chest tightness or shortness of breath.  No prior history of DVT or PE.  He has noticed swelling of the left ankle over the past several days.  No other alleviating measures attempted.    Past Medical History:  Diagnosis Date  . Hypertension      Immunizations up to date:  Yes.     Past Medical History:  Diagnosis Date  . Hypertension     Patient Active Problem List   Diagnosis Date Noted  . S/P laparoscopic cholecystectomy 07/06/2016  . Postoperative fever   . Prediabetes 03/29/2015  . Morbid obesity with BMI of 50.0-59.9, adult ([place]) 12/05/2013  . Idiopathic hypertension 12/14/2012  . Hyperlipidemia 12/14/2012    Past Surgical History:  Procedure Laterality Date  . CHOLECYSTECTOMY  06/02/2016   Procedure: LAPAROSCOPIC CHOLECYSTECTOMY;  Surgeon: Jules Husbands, MD;  Location: [facility] ORS;  Service: General;;  . NO PAST SURGERIES      Prior to Admission medications   Medication Sig Start Date End Date Taking? Authorizing Provider  atorvastatin (LIPITOR) 20 MG tablet Take 20 mg by mouth daily. 11/29/19   [provider]  meloxicam (MOBIC) 15 MG tablet Take 1 tablet (15 mg total) by mouth daily for 7 days. 01/15/20 01/22/20  Lannie Fields, PA-C  Olmesartan-Amlodipine-HCTZ 40-10-25 MG TABS Take 1 tablet by mouth every morning.  12/09/15   [provider]  predniSONE (DELTASONE) 20 MG tablet Take 3 tablets (60 mg total) by mouth daily with breakfast. 12/31/19   Harvest Dark, MD    Allergies Patient has no known allergies.  Family History  Problem Relation Age of Onset  . Heart disease Mother   . Diabetes Mother   . Hypertension Mother   . Heart disease Father   . Hypertension Father     Social History Social History   Tobacco Use  . Smoking status: Never Smoker  . Smokeless tobacco: Never Used  Substance Use Topics  . Alcohol use: No  . Drug use: No     Review of Systems  Constitutional: No fever/chills Eyes:  No discharge ENT: No upper respiratory complaints. Respiratory: no cough. No SOB/ use of accessory muscles to breath Gastrointestinal:   No nausea, no vomiting.  No diarrhea.  No constipation. Musculoskeletal: Patient has left knee pain Skin: Negative for rash, abrasions, lacerations, ecchymosis.  ____________________________________________   PHYSICAL EXAM:  VITAL SIGNS: ED Triage Vitals  Enc Vitals Group     BP 01/15/20 1656 (!) 162/87     Pulse Rate 01/15/20 1656 90     Resp 01/15/20 1656 18     Temp 01/15/20 1656 98.9 F (37.2 C)     Temp Source 01/15/20 1656 Oral     SpO2 01/15/20 1656 96 %     Weight 01/15/20 1657 (!) 398 lb (180.5 kg)  Height 01/15/20 1657 5\' 11"  (1.803 m)     Head Circumference --      Peak Flow --      Pain Score 01/15/20 1700 5     Pain Loc --      Pain Edu? --      Excl. in [place]? --      Constitutional: Alert and oriented. Well appearing and in no acute distress. Eyes: Conjunctivae are normal. PERRL. EOMI. Head: Atraumatic. Cardiovascular: Normal rate, regular rhythm. Normal S1 and S2.  Good peripheral circulation. Respiratory: Normal respiratory effort without tachypnea or retractions. Lungs CTAB. Good air entry to the bases with no decreased or absent breath sounds Gastrointestinal: Bowel sounds x 4 quadrants. Soft and nontender to palpation.  No guarding or rigidity. No distention. Musculoskeletal: Full range of motion to all extremities. No obvious deformities noted Neurologic:  Normal for age. No gross focal neurologic deficits are appreciated.  Skin: Patient has 1+ pitting edema left ankle.  No erythema overlying left lower extremity. Psychiatric: Mood and affect are normal for age. Speech and behavior are normal.   ____________________________________________   LABS (all labs ordered are listed, but only abnormal results are displayed)  Labs Reviewed  COMPREHENSIVE METABOLIC PANEL - Abnormal; Notable for the following components:      Result Value   Calcium 8.7 (*)    Albumin 3.4 (*)    All other components within normal limits  CBC WITH DIFFERENTIAL/PLATELET   ____________________________________________  EKG   ____________________________________________  RADIOLOGY 03/16/20, personally viewed and evaluated these images (plain radiographs) as part of my medical decision making, as well as reviewing the written report by the radiologist.    Geraldo Pitter Venous Img Lower Unilateral Left  Result Date: 01/15/2020 CLINICAL DATA:  Left lower extremity edema for 5 days EXAM: LEFT LOWER EXTREMITY VENOUS DOPPLER ULTRASOUND TECHNIQUE: Gray-scale sonography with compression, as well as color and duplex ultrasound, were performed to evaluate the deep venous system(s) from the level of the common femoral vein through the popliteal and proximal calf veins. COMPARISON:  None. FINDINGS: VENOUS Normal compressibility of the common femoral, superficial femoral, and popliteal veins, as well as the visualized calf veins. Visualized portions of profunda femoral vein and great saphenous vein unremarkable. No filling defects to suggest DVT on grayscale or color Doppler imaging. Doppler waveforms show normal direction of venous flow, normal respiratory plasticity and response to augmentation. Limited views of the contralateral common femoral  vein are unremarkable. OTHER None. Limitations: none IMPRESSION: Negative. Electronically Signed   By: 03/16/2020 M.D.   On: 01/15/2020 19:28   DG Knee Complete 4 Views Left  Result Date: 01/15/2020 CLINICAL DATA:  Leg swelling and stiffness for 2-3 weeks EXAM: LEFT KNEE - COMPLETE 4+ VIEW COMPARISON:  None. FINDINGS: Diffuse soft tissue edema is noted. No sizable left knee joint effusion. Mild tricompartmental degenerative changes are present as well as enthesopathic changes upon the patella and tibial tuberosity. No acute bony abnormality. Specifically, no fracture, subluxation, or dislocation. No suspicious osseous lesions. IMPRESSION: 1. Diffuse soft tissue edema. No acute bony abnormality. 2. Mild tricompartmental degenerative changes and enthesopathy of the extensor mechanism. Electronically Signed   By: 03/16/2020 M.D.   On: 01/15/2020 18:36    ____________________________________________    PROCEDURES  Procedure(s) performed:     Procedures     Medications - No data to display   ____________________________________________   INITIAL IMPRESSION / ASSESSMENT AND PLAN / ED COURSE  Pertinent labs &  imaging results that were available during my care of the patient were reviewed by me and considered in my medical decision making (see chart for details).    Assessment and plan:  Leg pain 49 year old male presents to the emergency department with left lower leg pain that has occurred over the past several days. Patient was hypertensive at triage vital signs were otherwise reassuring. On physical exam, patient perform limited range of motion at the left knee and endorsed tenderness along the posterior aspect of the left knee. He had 1+ pitting edema of the left ankle. There was no overlying erythema of the left lower extremity. Differential diagnosis originally included arthritis, DVT, venous insufficiency... Ultrasound of the left lower extremity revealed no evidence of  thrombus formation. 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 CBC and CMP were reassuring. No leukocytosis to suggest infection. 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 Recommended follow-up with orthopedics for possible cortisone injection. Return precautions were given to return with new or worsening symptoms. All patient questions were answered.   ____________________________________________  FINAL CLINICAL IMPRESSION(S) / ED DIAGNOSES  Final diagnoses:  Acute pain of left knee      NEW MEDICATIONS STARTED DURING THIS VISIT:  ED Discharge Orders         Ordered    meloxicam (MOBIC) 15 MG tablet  Daily     01/15/20 1940              This chart was dictated using voice recognition software/Dragon. Despite best efforts to proofread, errors can occur which can change the meaning. Any change was purely unintentional.     Orvil Feil, PA-C 01/15/20 1948    Willy Eddy, MD 01/15/20 2032

## 2020-01-15 NOTE — Discharge Instructions (Signed)
Take meloxicam once daily for pain and inflammation. 

## 2020-01-15 NOTE — ED Notes (Signed)
See triage note, pt reports pain in left leg/knee that started Saturday. States feels "stiff". Reports increased pain with ambulation

## 2020-04-24 ENCOUNTER — Other Ambulatory Visit: Payer: Self-pay

## 2020-04-24 DIAGNOSIS — N200 Calculus of kidney: Secondary | ICD-10-CM | POA: Insufficient documentation

## 2020-04-24 DIAGNOSIS — R109 Unspecified abdominal pain: Secondary | ICD-10-CM | POA: Diagnosis present

## 2020-04-24 DIAGNOSIS — I10 Essential (primary) hypertension: Secondary | ICD-10-CM | POA: Insufficient documentation

## 2020-04-24 DIAGNOSIS — Z79899 Other long term (current) drug therapy: Secondary | ICD-10-CM | POA: Diagnosis not present

## 2020-04-24 LAB — CBC
HCT: 43.4 % (ref 39.0–52.0)
Hemoglobin: 13.5 g/dL (ref 13.0–17.0)
MCH: 26.2 pg (ref 26.0–34.0)
MCHC: 31.1 g/dL (ref 30.0–36.0)
MCV: 84.1 fL (ref 80.0–100.0)
Platelets: 196 10*3/uL (ref 150–400)
RBC: 5.16 MIL/uL (ref 4.22–5.81)
RDW: 13.8 % (ref 11.5–15.5)
WBC: 5 10*3/uL (ref 4.0–10.5)
nRBC: 0 % (ref 0.0–0.2)

## 2020-04-24 LAB — URINALYSIS, COMPLETE (UACMP) WITH MICROSCOPIC
Bilirubin Urine: NEGATIVE
Glucose, UA: NEGATIVE mg/dL
Ketones, ur: NEGATIVE mg/dL
Leukocytes,Ua: NEGATIVE
Nitrite: NEGATIVE
Protein, ur: 100 mg/dL — AB
RBC / HPF: >50 RBC/hpf — ABNORMAL HIGH (ref 0–5)
Specific Gravity, Urine: 1.025 (ref 1.005–1.030)
Squamous Epithelial / HPF: NONE SEEN (ref 0–5)
pH: 6 (ref 5.0–8.0)

## 2020-04-24 LAB — BASIC METABOLIC PANEL
Anion gap: 10 (ref 5–15)
BUN: 17 mg/dL (ref 6–20)
CO2: 29 mmol/L (ref 22–32)
Calcium: 9.2 mg/dL (ref 8.9–10.3)
Chloride: 101 mmol/L (ref 98–111)
Creatinine, Ser: 1.02 mg/dL (ref 0.61–1.24)
GFR calc Af Amer: >60 mL/min (ref >60)
GFR calc non Af Amer: >60 mL/min (ref >60)
Glucose, Bld: 86 mg/dL (ref 70–99)
Potassium: 3.5 mmol/L (ref 3.5–5.1)
Sodium: 140 mmol/L (ref 135–145)

## 2020-04-24 NOTE — ED Triage Notes (Signed)
Pt states blood in urine off and on for a week with "a little" pain in left side. Pt denies fever, appears in no acute distress.

## 2020-04-25 ENCOUNTER — Emergency Department
Admission: EM | Admit: 2020-04-25 | Discharge: 2020-04-25 | Disposition: A | Discharge status: Home / Self Care | Payer: BC Managed Care – PPO | Attending: Emergency Medicine | Admitting: Emergency Medicine

## 2020-04-25 ENCOUNTER — Emergency Department: Payer: BC Managed Care – PPO

## 2020-04-25 DIAGNOSIS — N200 Calculus of kidney: Secondary | ICD-10-CM

## 2020-04-25 MED ORDER — IBUPROFEN 600 MG PO TABS
600.0000 mg | ORAL_TABLET | ORAL | Status: AC
  Administered 2020-04-25: 600 mg via ORAL
  Filled 2020-04-25: qty 1

## 2020-04-25 NOTE — ED Notes (Signed)
Patient transported to CT 

## 2020-04-25 NOTE — ED Provider Notes (Signed)
[facility] Emergency Department Provider Note   ____________________________________________   First MD Initiated Contact with Patient 04/25/20 0250     (approximate)  I have reviewed the triage vital signs and the nursing notes.   HISTORY  Chief Complaint Flank Pain and Hematuria    HPI Nathan Burch is a 49 y.o. male reports a history of hypertension and edema.  Also previous kidney stones and gallbladder issue  Patient reports he believes he might be certain to get a kidney stone.  He noticed some blood in his urine the last couple days also very slight achy pain over his left flank.  No other symptoms.  No nausea vomiting.  No fevers or chills.  Reports when he had kidney stones before his eyes had severe pain, but this feels like a little bit of blood in his urine and a little bit of achiness in a similar area  No chest pain or trouble breathing.  No fevers or chills.  No burning with urination.  No abnormal urine odor.  No chills or body aches  Does not currently see a urologist, reports he is passed some pretty large kidney stones in the past but never had to have a surgery or anything   Past Medical History:  Diagnosis Date   Hypertension     Patient Active Problem List   Diagnosis Date Noted   S/P laparoscopic cholecystectomy 07/06/2016   Postoperative fever    Prediabetes 03/29/2015   Morbid obesity with BMI of 50.0-59.9, adult ([facility]) 12/05/2013   Idiopathic hypertension 12/14/2012   Hyperlipidemia 12/14/2012    Past Surgical History:  Procedure Laterality Date   CHOLECYSTECTOMY  06/02/2016   Procedure: LAPAROSCOPIC CHOLECYSTECTOMY;  Surgeon: Leafy Ro, MD;  Location: [facility] ORS;  Service: General;;   NO PAST SURGERIES      Prior to Admission medications   Medication Sig Start Date End Date Taking? Authorizing Provider  atorvastatin (LIPITOR) 20 MG tablet Take 20 mg by mouth daily. 11/29/19   [provider]    Olmesartan-Amlodipine-HCTZ 40-10-25 MG TABS Take 1 tablet by mouth every morning.  12/09/15   [provider]  predniSONE (DELTASONE) 20 MG tablet Take 3 tablets (60 mg total) by mouth daily with breakfast. 12/31/19   Minna Antis, MD    Allergies Patient has no known allergies.  Family History  Problem Relation Age of Onset   Heart disease Mother    Diabetes Mother    Hypertension Mother    Heart disease Father    Hypertension Father     Social History Social History   Tobacco Use   Smoking status: Never Smoker   Smokeless tobacco: Never Used  Substance Use Topics   Alcohol use: No   Drug use: No    Review of Systems Constitutional: No fever/chills Eyes: No visual changes. ENT: No sore throat. Cardiovascular: Denies chest pain. Respiratory: Denies shortness of breath. Gastrointestinal: No abdominal pain.  Reports a little bit of discomfort along the left flank left back, very mild. Genitourinary: Negative for dysuria.  Seen some blood in his urine Musculoskeletal: Negative for back pain. Skin: Negative for rash. Neurological: Negative for headaches, areas of focal weakness or numbness.    ____________________________________________   PHYSICAL EXAM:  VITAL SIGNS: ED Triage Vitals [04/24/20 1917]  Enc Vitals Group     BP (!) 150/79     Pulse Rate 87     Resp 16     Temp 98.2 F (36.8 C)  Temp Source Oral     SpO2 100 %     Weight (!) 390 lb (176.9 kg)     Height 5\' 11"  (1.803 m)     Head Circumference      Peak Flow      Pain Score 5     Pain Loc      Pain Edu?      Excl. in [place]?     Constitutional: Alert and oriented. Well appearing and in no acute distress. Eyes: Conjunctivae are normal. Head: Atraumatic. Nose: No congestion/rhinnorhea. Mouth/Throat: Mucous membranes are moist. Neck: No stridor.  Cardiovascular: Normal rate, regular rhythm. Grossly normal heart sounds.  Good peripheral circulation. Respiratory: Normal  respiratory effort.  No retractions. Lungs CTAB. Gastrointestinal: Soft and nontender. No distention.  Very mild left CVA tenderness.  No pain or discomfort to deep palpation in any quadrant.  No rebound or guarding any quadrant. Musculoskeletal: No lower extremity tenderness nor edema. Neurologic:  Normal speech and language. No gross focal neurologic deficits are appreciated.  Skin:  Skin is warm, dry and intact. No rash noted. Psychiatric: Mood and affect are normal. Speech and behavior are normal.  ____________________________________________   LABS (all labs ordered are listed, but only abnormal results are displayed)  Labs Reviewed  URINALYSIS, COMPLETE (UACMP) WITH MICROSCOPIC - Abnormal; Notable for the following components:      Result Value   Color, Urine AMBER (*)    APPearance CLOUDY (*)    Hgb urine dipstick LARGE (*)    Protein, ur 100 (*)    RBC / HPF >50 (*)    Bacteria, UA RARE (*)    All other components within normal limits  URINE CULTURE  CBC  BASIC METABOLIC PANEL   ____________________________________________  EKG   ____________________________________________  RADIOLOGY  IMPRESSION:  1. No acute abnormality of the abdomen or pelvis.  2. Nonobstructing 9 mm left renal calculus at the lower renal  pelvis. This could cause hematuria.   CT reviewed, nonobstructing renal calculus at the left lower renal pelvis ____________________________________________   PROCEDURES  Procedure(s) performed: None  Procedures  Critical Care performed: No  ____________________________________________   INITIAL IMPRESSION / ASSESSMENT AND PLAN / ED COURSE  Pertinent labs & imaging results that were available during my care of the patient were reviewed by me and considered in my medical decision making (see chart for details).   Differential diagnosis includes but is not limited to, abdominal perforation, aortic dissection, cholecystitis, appendicitis,  diverticulitis, colitis, esophagitis/gastritis, kidney stone, pyelonephritis, urinary tract infection, aortic aneurysm, etc. All are considered in decision and treatment plan.  Given the patient's clinical presentation suspect likely renal/urologic in etiology.  Denies any infectious symptoms.  No signs or symptoms of urinary tract infection.  No fever, no systemic symptoms.  Mild left CVA discomfort and CT imaging consistent with renal calculus which seems consistent with cause for mild discomfort.  There is no evidence that is fallen to cause hydronephrosis or obstruction, his pain is well controlled very minimal without pain medication.  Discussed with Dr. of urology, he recommends the patient set up follow-up on Monday at this point would not recommend use of Flomax or any other particular medications at this time but rather to get the patient to come to urology clinic to have further evaluated     ----------------------------------------- 3:39 AM on 04/25/2020 -----------------------------------------  Return precautions and treatment recommendations and follow-up discussed with the patient who is agreeable with the plan.  ____________________________________________  FINAL CLINICAL IMPRESSION(S) / ED DIAGNOSES  Final diagnoses:  Kidney stone on left side        Note:  This document was prepared using Dragon voice recognition software and may include unintentional dictation errors       Sharyn Creamer, MD 04/25/20 [phone]

## 2020-04-26 LAB — URINE CULTURE: Culture: <10000 — AB

## 2020-04-30 ENCOUNTER — Encounter: Payer: Self-pay | Admitting: Urology

## 2020-04-30 ENCOUNTER — Ambulatory Visit (INDEPENDENT_AMBULATORY_CARE_PROVIDER_SITE_OTHER): Payer: BC Managed Care – PPO | Admitting: Urology

## 2020-04-30 ENCOUNTER — Other Ambulatory Visit: Payer: Self-pay

## 2020-04-30 VITALS — BP 156/93 | HR 103 | Ht 71.0 in | Wt 388.0 lb

## 2020-04-30 DIAGNOSIS — N200 Calculus of kidney: Secondary | ICD-10-CM | POA: Diagnosis not present

## 2020-04-30 NOTE — Progress Notes (Signed)
04/30/20 10:17 AM   Lane Hacker 11-May-1971 [id number]  CC: Left flank pain, hematuria  HPI: I saw Mr. Nathan Burch in urology clinic today for evaluation of hematuria and left-sided flank pain.  He is a 49 year old African-American male with morbid obesity and BMI 54 who presented to the ED on 04/25/2020 with intermittent left-sided flank pain and possible gross hematuria.  CT scan showed a 9 mm left renal pelvis stone with no significant hydronephrosis, 1100HU, 15cm SSD.  He denies any fevers or chills, or urinary symptoms.  He reports 2 prior stone episodes, but has never required surgical intervention for stones.  His pain is currently well controlled.  He has been using NSAIDs for pain control.  Urinalysis in the ED was notable for greater than 50 RBCs, 11-20 WBCs, rare bacteria, yeast present, no squamous cells, nitrite negative, no leukocytes.  Urine culture showed no growth.  On reviewing his prior CT from 2017, the stone measured 4 mm at that time and has doubled in size since then.  Repeat urinalysis today is pending.  Family History: Family History  Problem Relation Age of Onset  . Heart disease Mother   . Diabetes Mother   . Hypertension Mother   . Heart disease Father   . Hypertension Father     Social History:  reports that he has never smoked. He has never used smokeless tobacco. He reports that he does not drink alcohol and does not use drugs.  Physical Exam: BP (!) 156/93   Pulse (!) 103   Ht 5\' 11"  (1.803 m)   Wt (!) 388 lb (176 kg)   BMI 54.12 kg/m    Constitutional:  Alert and oriented, No acute distress. Cardiovascular: No clubbing, cyanosis, or edema. Respiratory: Normal respiratory effort, no increased work of breathing. GI: Abdomen is soft, nontender, nondistended, no abdominal masses  Laboratory Data: Reviewed, see HPI  Pertinent Imaging: I have personally reviewed the CT, notable for a 9 mm left renal stone, 1100HU, 15cm SSD.  Stone is clearly  seen on scout CT.  Assessment & Plan:   In summary he is a 49 year old male with a 9 mm left renal stone likely causing intermittent obstruction and renal colic with negative urine culture this week and pain currently well controlled.  We discussed various treatment options for urolithiasis including observation with or without medical expulsive therapy, shockwave lithotripsy (SWL), ureteroscopy and laser lithotripsy with stent placement, and percutaneous nephrolithotomy.  We discussed that management is based on stone size, location, density, patient co-morbidities, and patient preference.   Stones <55mm in size have a >80% spontaneous passage rate. Data surrounding the use of tamsulosin for medical expulsive therapy is controversial, but meta analyses suggests it is most efficacious for distal stones between 5-27mm in size. Possible side effects include dizziness/lightheadedness, and retrograde ejaculation.  SWL has a lower stone free rate in a single procedure, but also a lower complication rate compared to ureteroscopy and avoids a stent and associated stent related symptoms. Possible complications include renal hematoma, steinstrasse, and need for additional treatment.  Ureteroscopy with laser lithotripsy and stent placement has a higher stone free rate than SWL in a single procedure, however increased complication rate including possible infection, ureteral injury, bleeding, and stent related morbidity. Common stent related symptoms include dysuria, urgency/frequency, and flank pain.  I had a very frank conversation with the patient that with his morbid obesity his skin to stone distance is larger than we typically would recommend shockwave lithotripsy.  He  is adamantly opposed to ureteroscopy and stent, as his father recently had a ureteral stent and had a significant amount of pain and discomfort from this.  He understands the increased risk of obstructive fragments and possible need for  ureteroscopy in the future if incomplete fragmentation of stone or obstructive fragments.  Urinalysis and culture today Schedule left shockwave lithotripsy next week  Ellakate Gonsalves, MD 04/30/2020  [facility] [address], [address] [place], [place] [address] [phone]   

## 2020-04-30 NOTE — H&P (View-Only) (Signed)
04/30/20 10:17 AM   Nathan Burch 11-May-1971 [id number]  CC: Left flank pain, hematuria  HPI: I saw Nathan Burch in urology clinic today for evaluation of hematuria and left-sided flank pain.  He is a 49 year old African-American male with morbid obesity and BMI 54 who presented to the ED on 04/25/2020 with intermittent left-sided flank pain and possible gross hematuria.  CT scan showed a 9 mm left renal pelvis stone with no significant hydronephrosis, 1100HU, 15cm SSD.  He denies any fevers or chills, or urinary symptoms.  He reports 2 prior stone episodes, but has never required surgical intervention for stones.  His pain is currently well controlled.  He has been using NSAIDs for pain control.  Urinalysis in the ED was notable for greater than 50 RBCs, 11-20 WBCs, rare bacteria, yeast present, no squamous cells, nitrite negative, no leukocytes.  Urine culture showed no growth.  On reviewing his prior CT from 2017, the stone measured 4 mm at that time and has doubled in size since then.  Repeat urinalysis today is pending.  Family History: Family History  Problem Relation Age of Onset  . Heart disease Mother   . Diabetes Mother   . Hypertension Mother   . Heart disease Father   . Hypertension Father     Social History:  reports that he has never smoked. He has never used smokeless tobacco. He reports that he does not drink alcohol and does not use drugs.  Physical Exam: BP (!) 156/93   Pulse (!) 103   Ht 5\' 11"  (1.803 m)   Wt (!) 388 lb (176 kg)   BMI 54.12 kg/m    Constitutional:  Alert and oriented, No acute distress. Cardiovascular: No clubbing, cyanosis, or edema. Respiratory: Normal respiratory effort, no increased work of breathing. GI: Abdomen is soft, nontender, nondistended, no abdominal masses  Laboratory Data: Reviewed, see HPI  Pertinent Imaging: I have personally reviewed the CT, notable for a 9 mm left renal stone, 1100HU, 15cm SSD.  Stone is clearly  seen on scout CT.  Assessment & Plan:   In summary he is a 49 year old male with a 9 mm left renal stone likely causing intermittent obstruction and renal colic with negative urine culture this week and pain currently well controlled.  We discussed various treatment options for urolithiasis including observation with or without medical expulsive therapy, shockwave lithotripsy (SWL), ureteroscopy and laser lithotripsy with stent placement, and percutaneous nephrolithotomy.  We discussed that management is based on stone size, location, density, patient co-morbidities, and patient preference.   Stones <55mm in size have a >80% spontaneous passage rate. Data surrounding the use of tamsulosin for medical expulsive therapy is controversial, but meta analyses suggests it is most efficacious for distal stones between 5-27mm in size. Possible side effects include dizziness/lightheadedness, and retrograde ejaculation.  SWL has a lower stone free rate in a single procedure, but also a lower complication rate compared to ureteroscopy and avoids a stent and associated stent related symptoms. Possible complications include renal hematoma, steinstrasse, and need for additional treatment.  Ureteroscopy with laser lithotripsy and stent placement has a higher stone free rate than SWL in a single procedure, however increased complication rate including possible infection, ureteral injury, bleeding, and stent related morbidity. Common stent related symptoms include dysuria, urgency/frequency, and flank pain.  I had a very frank conversation with the patient that with his morbid obesity his skin to stone distance is larger than we typically would recommend shockwave lithotripsy.  He  is adamantly opposed to ureteroscopy and stent, as his father recently had a ureteral stent and had a significant amount of pain and discomfort from this.  He understands the increased risk of obstructive fragments and possible need for  ureteroscopy in the future if incomplete fragmentation of stone or obstructive fragments.  Urinalysis and culture today Schedule left shockwave lithotripsy next week  Legrand Rams, MD 04/30/2020  [facility] [address] [place], [place] [address] [phone]

## 2020-04-30 NOTE — Patient Instructions (Signed)
Lithotripsy  Lithotripsy is a treatment that can sometimes help eliminate kidney stones and the pain that they cause. A form of lithotripsy, also known as extracorporeal shock wave lithotripsy, is a nonsurgical procedure that crushes a kidney stone with shock waves. These shock waves pass through your body and focus on the kidney stone. They cause the kidney stone to break up while it is still in the urinary tract. This makes it easier for the smaller pieces of stone to pass in the urine. Tell a health care provider about:  Any allergies you have.  All medicines you are taking, including vitamins, herbs, eye drops, creams, and over-the-counter medicines.  Any blood disorders you have.  Any surgeries you have had.  Any medical conditions you have.  Whether you are pregnant or may be pregnant.  Any problems you or family members have had with anesthetic medicines. What are the risks? Generally, this is a safe procedure. However, problems may occur, including:  Infection.  Bleeding of the kidney.  Bruising of the kidney or skin.  Scarring of the kidney, which can lead to: ? Increased blood pressure. ? Poor kidney function. ? Return (recurrence) of kidney stones.  Damage to other structures or organs, such as the liver, colon, spleen, or pancreas.  Blockage (obstruction) of the the tube that carries urine from the kidney to the bladder (ureter).  Failure of the kidney stone to break into pieces (fragments). What happens before the procedure? Staying hydrated Follow instructions from your health care provider about hydration, which may include:  Up to 2 hours before the procedure - you may continue to drink clear liquids, such as water, clear fruit juice, black coffee, and plain tea. Eating and drinking restrictions Follow instructions from your health care provider about eating and drinking, which may include:  8 hours before the procedure - stop eating heavy meals or foods  such as meat, fried foods, or fatty foods.  6 hours before the procedure - stop eating light meals or foods, such as toast or cereal.  6 hours before the procedure - stop drinking milk or drinks that contain milk.  2 hours before the procedure - stop drinking clear liquids. General instructions  Plan to have someone take you home from the hospital or clinic.  Ask your health care provider about: ? Changing or stopping your regular medicines. This is especially important if you are taking diabetes medicines or blood thinners. ? Taking medicines such as aspirin and ibuprofen. These medicines and other NSAIDs can thin your blood. Do not take these medicines for 7 days before your procedure if your health care provider instructs you not to.  You may have tests, such as: ? Blood tests. ? Urine tests. ? Imaging tests, such as a CT scan. What happens during the procedure?  To lower your risk of infection: ? Your health care team will wash or sanitize their hands. ? Your skin will be washed with soap.  An IV tube will be inserted into one of your veins. This tube will give you fluids and medicines.  You will be given one or more of the following: ? A medicine to help you relax (sedative). ? A medicine to make you fall asleep (general anesthetic).  A water-filled cushion may be placed behind your kidney or on your abdomen. In some cases you may be placed in a tub of lukewarm water.  Your body will be positioned in a way that makes it easy to target the kidney   stone.  A flexible tube with holes in it (stent) may be placed in the ureter. This will help keep urine flowing from the kidney if the fragments of the stone have been blocking the ureter.  An X-ray or ultrasound exam will be done to locate your stone.  Shock waves will be aimed at the stone. If you are awake, you may feel a tapping sensation as the shock waves pass through your body. The procedure may vary among health care  providers and hospitals. What happens after the procedure?  You may have an X-ray to see whether the procedure was able to break up the kidney stone and how much of the stone has passed. If large stone fragments remain after treatment, you may need to have a second procedure at a later time.  Your blood pressure, heart rate, breathing rate, and blood oxygen level will be monitored until the medicines you were given have worn off.  You may be given antibiotics or pain medicine as needed.  If a stent was placed in your ureter during surgery, it may stay in place for a few weeks.  You may need strain your urine to collect pieces of the kidney stone for testing.  You will need to drink plenty of water.  Do not drive for 24 hours if you were given a sedative. Summary  Lithotripsy is a treatment that can sometimes help eliminate kidney stones and the pain that they cause.  A form of lithotripsy, also known as extracorporeal shock wave lithotripsy, is a nonsurgical procedure that crushes a kidney stone with shock waves.  Generally, this is a safe procedure. However, problems may occur, including damage to the kidney or other organs, infection, or obstruction of the tube that carries urine from the kidney to the bladder (ureter).  When you go home, you will need to drink plenty of water. You may be asked to strain your urine to collect pieces of the kidney stone for testing. This information is not intended to replace advice given to you by your health care provider. Make sure you discuss any questions you have with your health care provider. Document Revised: 12/03/2018 Document Reviewed: 07/13/2016 Elsevier Patient Education  2020 Elsevier Inc.    Lithotripsy, Care After This sheet gives you information about how to care for yourself after your procedure. Your health care provider may also give you more specific instructions. If you have problems or questions, contact your health care  provider. What can I expect after the procedure? After the procedure, it is common to have:  Some blood in your urine. This should only last for a few days.  Soreness in your back, sides, or upper abdomen for a few days.  Blotches or bruises on your back where the pressure wave entered the skin.  Pain, discomfort, or nausea when pieces (fragments) of the kidney stone move through the tube that carries urine from the kidney to the bladder (ureter). Stone fragments may pass soon after the procedure, but they may continue to pass for up to 4-8 weeks. ? If you have severe pain or nausea, contact your health care provider. This may be caused by a large stone that was not broken up, and this may mean that you need more treatment.  Some pain or discomfort during urination.  Some pain or discomfort in the lower abdomen or (in men) at the base of the penis. Follow these instructions at home: Medicines  Take over-the-counter and prescription medicines only as told   by your health care provider.  If you were prescribed an antibiotic medicine, take it as told by your health care provider. Do not stop taking the antibiotic even if you start to feel better.  Do not drive for 24 hours if you were given a medicine to help you relax (sedative).  Do not drive or use heavy machinery while taking prescription pain medicine. Eating and drinking      Drink enough water and fluids to keep your urine clear or pale yellow. This helps any remaining pieces of the stone to pass. It can also help prevent new stones from forming.  Eat plenty of fresh fruits and vegetables.  Follow instructions from your health care provider about eating and drinking restrictions. You may be instructed: ? To reduce how much salt (sodium) you eat or drink. Check ingredients and nutrition facts on packaged foods and beverages. ? To reduce how much meat you eat.  Eat the recommended amount of calcium for your age and gender. Ask  your health care provider how much calcium you should have. General instructions  Get plenty of rest.  Most people can resume normal activities 1-2 days after the procedure. Ask your health care provider what activities are safe for you.  Your health care provider may direct you to lie in a certain position (postural drainage) and tap firmly (percuss) over your kidney area to help stone fragments pass. Follow instructions as told by your health care provider.  If directed, strain all urine through the strainer that was provided by your health care provider. ? Keep all fragments for your health care provider to see. Any stones that are found may be sent to a medical lab for examination. The stone may be as small as a grain of salt.  Keep all follow-up visits as told by your health care provider. This is important. Contact a health care provider if:  You have pain that is severe or does not get better with medicine.  You have nausea that is severe or does not go away.  You have blood in your urine longer than your health care provider told you to expect.  You have more blood in your urine.  You have pain during urination that does not go away.  You urinate more frequently than usual and this does not go away.  You develop a rash or any other possible signs of an allergic reaction. Get help right away if:  You have severe pain in your back, sides, or upper abdomen.  You have severe pain while urinating.  Your urine is very dark red.  You have blood in your stool (feces).  You cannot pass any urine at all.  You feel a strong urge to urinate after emptying your bladder.  You have a fever or chills.  You develop shortness of breath, difficulty breathing, or chest pain.  You have severe nausea that leads to persistent vomiting.  You faint. Summary  After this procedure, it is common to have some pain, discomfort, or nausea when pieces (fragments) of the kidney stone move  through the tube that carries urine from the kidney to the bladder (ureter). If this pain or nausea is severe, however, you should contact your health care provider.  Most people can resume normal activities 1-2 days after the procedure. Ask your health care provider what activities are safe for you.  Drink enough water and fluids to keep your urine clear or pale yellow. This helps any remaining pieces of   the stone to pass, and it can help prevent new stones from forming.  If directed, strain your urine and keep all fragments for your health care provider to see. Fragments or stones may be as small as a grain of salt.  Get help right away if you have severe pain in your back, sides, or upper abdomen or have severe pain while urinating. This information is not intended to replace advice given to you by your health care provider. Make sure you discuss any questions you have with your health care provider. Document Revised: 12/03/2018 Document Reviewed: 07/13/2016 Elsevier Patient Education  2020 Elsevier Inc.  

## 2020-05-01 LAB — MICROSCOPIC EXAMINATION: RBC, Urine: >30 /hpf — AB (ref 0–2)

## 2020-05-01 LAB — URINALYSIS, COMPLETE
Bilirubin, UA: NEGATIVE
Glucose, UA: NEGATIVE
Ketones, UA: NEGATIVE
Leukocytes,UA: NEGATIVE
Nitrite, UA: NEGATIVE
Specific Gravity, UA: 1.025 (ref 1.005–1.030)
Urobilinogen, Ur: 0.2 mg/dL (ref 0.2–1.0)
pH, UA: 6 (ref 5.0–7.5)

## 2020-05-05 ENCOUNTER — Other Ambulatory Visit: Payer: Self-pay

## 2020-05-05 ENCOUNTER — Other Ambulatory Visit
Admission: RE | Admit: 2020-05-05 | Discharge: 2020-05-05 | Disposition: A | Discharge status: Home / Self Care | Payer: BC Managed Care – PPO | Source: Ambulatory Visit | Attending: Urology | Admitting: Urology

## 2020-05-05 DIAGNOSIS — Z01812 Encounter for preprocedural laboratory examination: Secondary | ICD-10-CM | POA: Diagnosis present

## 2020-05-05 DIAGNOSIS — Z20822 Contact with and (suspected) exposure to covid-19: Secondary | ICD-10-CM | POA: Insufficient documentation

## 2020-05-05 LAB — CULTURE, URINE COMPREHENSIVE

## 2020-05-05 LAB — SARS CORONAVIRUS 2 (TAT 6-24 HRS): SARS Coronavirus 2: NEGATIVE

## 2020-05-07 ENCOUNTER — Other Ambulatory Visit: Payer: Self-pay

## 2020-05-07 ENCOUNTER — Ambulatory Visit: Payer: BC Managed Care – PPO

## 2020-05-07 ENCOUNTER — Ambulatory Visit
Admission: RE | Admit: 2020-05-07 | Discharge: 2020-05-07 | Disposition: A | Discharge status: Home / Self Care | Payer: BC Managed Care – PPO | Attending: Urology | Admitting: Urology

## 2020-05-07 ENCOUNTER — Encounter
Admission: RE | Disposition: A | Discharge status: Home / Self Care | Payer: Self-pay | Source: Home / Self Care | Attending: Urology

## 2020-05-07 ENCOUNTER — Encounter: Payer: Self-pay | Admitting: Urology

## 2020-05-07 DIAGNOSIS — Z6841 Body Mass Index (BMI) 40.0 and over, adult: Secondary | ICD-10-CM | POA: Diagnosis not present

## 2020-05-07 DIAGNOSIS — N200 Calculus of kidney: Secondary | ICD-10-CM | POA: Diagnosis present

## 2020-05-07 HISTORY — PX: EXTRACORPOREAL SHOCK WAVE LITHOTRIPSY: SHX1557

## 2020-05-07 HISTORY — DX: Calculus of kidney: N20.0

## 2020-05-07 SURGERY — LITHOTRIPSY, ESWL
Anesthesia: Moderate Sedation | Laterality: Left

## 2020-05-07 MED ORDER — ONDANSETRON HCL 4 MG/2ML IJ SOLN
INTRAMUSCULAR | Status: AC
  Administered 2020-05-07: 4 mg via INTRAVENOUS
  Filled 2020-05-07: qty 2

## 2020-05-07 MED ORDER — CIPROFLOXACIN HCL 500 MG PO TABS: 500.0000 mg | ORAL_TABLET | ORAL | Status: AC

## 2020-05-07 MED ORDER — DIAZEPAM 5 MG PO TABS
ORAL_TABLET | ORAL | Status: AC
  Administered 2020-05-07: 10 mg via ORAL
  Filled 2020-05-07: qty 2

## 2020-05-07 MED ORDER — TAMSULOSIN HCL 0.4 MG PO CAPS: 0.4000 mg | ORAL_CAPSULE | Freq: Every day | ORAL | 0 refills | Status: AC

## 2020-05-07 MED ORDER — DIPHENHYDRAMINE HCL 25 MG PO CAPS
ORAL_CAPSULE | ORAL | Status: AC
  Administered 2020-05-07: 25 mg via ORAL
  Filled 2020-05-07: qty 1

## 2020-05-07 MED ORDER — DIAZEPAM 5 MG PO TABS: 10.0000 mg | ORAL_TABLET | ORAL | Status: AC

## 2020-05-07 MED ORDER — DIPHENHYDRAMINE HCL 25 MG PO CAPS: 25.0000 mg | ORAL_CAPSULE | ORAL | Status: AC

## 2020-05-07 MED ORDER — CIPROFLOXACIN HCL 500 MG PO TABS
ORAL_TABLET | ORAL | Status: AC
  Administered 2020-05-07: 500 mg via ORAL
  Filled 2020-05-07: qty 1

## 2020-05-07 MED ORDER — HYDROCODONE-ACETAMINOPHEN 5-325 MG PO TABS: 1.0000 | ORAL_TABLET | Freq: Four times a day (QID) | ORAL | 0 refills | Status: DC | PRN

## 2020-05-07 MED ORDER — SODIUM CHLORIDE 0.9 % IV SOLN: INTRAVENOUS | Status: DC

## 2020-05-07 MED ORDER — ONDANSETRON HCL 4 MG/2ML IJ SOLN: 4.0000 mg | Freq: Once | INTRAMUSCULAR | Status: AC

## 2020-05-07 NOTE — Interval H&P Note (Signed)
History and Physical Interval Note:  05/07/2020 12:10 PM  Nathan Burch  has presented today for surgery, with the diagnosis of Kidney stone.  The various methods of treatment have been discussed with the patient and family. After consideration of risks, benefits and other options for treatment, the patient has consented to  Procedure(s): EXTRACORPOREAL SHOCK WAVE LITHOTRIPSY (ESWL) (Left) as a surgical intervention.  The patient's history has been reviewed, patient examined, no change in status, stable for surgery.  I have reviewed the patient's chart and labs.  Questions were answered to the patient's satisfaction.     Vanna Scotland

## 2020-05-07 NOTE — Discharge Instructions (Addendum)
See [facility] discharge instructions in chart.  AMBULATORY SURGERY  DISCHARGE INSTRUCTIONS   1) The drugs that you were given will stay in your system until tomorrow so for the next 24 hours you should not:  A) Drive an automobile B) Make any legal decisions C) Drink any alcoholic beverage   2) You may resume regular meals tomorrow.  Today it is better to start with liquids and gradually work up to solid foods.  You may eat anything you prefer, but it is better to start with liquids, then soup and crackers, and gradually work up to solid foods.   3) Please notify your doctor immediately if you have any unusual bleeding, trouble breathing, redness and pain at the surgery site, drainage, fever, or pain not relieved by medication.    4) Additional Instructions:        Please contact your physician with any problems or Same Day Surgery at [phone], Monday through Friday 6 am to 4 pm, or [place] [place] Main number at [phone].  

## 2020-05-07 NOTE — Interval H&P Note (Signed)
History and Physical Interval Note:  05/07/2020 11:27 AM  Nathan Burch  has presented today for surgery, with the diagnosis of Kidney stone.  The various methods of treatment have been discussed with the patient and family. After consideration of risks, benefits and other options for treatment, the patient has consented to  Procedure(s): EXTRACORPOREAL SHOCK WAVE LITHOTRIPSY (ESWL) (Left) as a surgical intervention.  The patient's history has been reviewed, patient examined, no change in status, stable for surgery.  I have reviewed the patient's chart and labs.  Questions were answered to the patient's satisfaction.     Vanna Scotland

## 2020-05-08 ENCOUNTER — Other Ambulatory Visit: Payer: Self-pay | Admitting: Radiology

## 2020-05-08 DIAGNOSIS — N200 Calculus of kidney: Secondary | ICD-10-CM

## 2020-05-10 ENCOUNTER — Other Ambulatory Visit: Payer: Self-pay

## 2020-05-10 ENCOUNTER — Emergency Department
Admission: EM | Admit: 2020-05-10 | Discharge: 2020-05-10 | Disposition: A | Discharge status: Home / Self Care | Payer: BC Managed Care – PPO | Attending: Student in an Organized Health Care Education/Training Program | Admitting: Student in an Organized Health Care Education/Training Program

## 2020-05-10 ENCOUNTER — Emergency Department: Payer: BC Managed Care – PPO

## 2020-05-10 DIAGNOSIS — R109 Unspecified abdominal pain: Secondary | ICD-10-CM | POA: Diagnosis present

## 2020-05-10 DIAGNOSIS — Z79899 Other long term (current) drug therapy: Secondary | ICD-10-CM | POA: Insufficient documentation

## 2020-05-10 DIAGNOSIS — I10 Essential (primary) hypertension: Secondary | ICD-10-CM | POA: Insufficient documentation

## 2020-05-10 DIAGNOSIS — N201 Calculus of ureter: Secondary | ICD-10-CM

## 2020-05-10 DIAGNOSIS — N132 Hydronephrosis with renal and ureteral calculous obstruction: Secondary | ICD-10-CM | POA: Insufficient documentation

## 2020-05-10 LAB — BASIC METABOLIC PANEL
Anion gap: 11 (ref 5–15)
BUN: 17 mg/dL (ref 6–20)
CO2: 30 mmol/L (ref 22–32)
Calcium: 9.4 mg/dL (ref 8.9–10.3)
Chloride: 97 mmol/L — ABNORMAL LOW (ref 98–111)
Creatinine, Ser: 1.34 mg/dL — ABNORMAL HIGH (ref 0.61–1.24)
GFR calc Af Amer: >60 mL/min (ref >60)
GFR calc non Af Amer: >60 mL/min (ref >60)
Glucose, Bld: 108 mg/dL — ABNORMAL HIGH (ref 70–99)
Potassium: 3.4 mmol/L — ABNORMAL LOW (ref 3.5–5.1)
Sodium: 138 mmol/L (ref 135–145)

## 2020-05-10 LAB — URINALYSIS, COMPLETE (UACMP) WITH MICROSCOPIC
Bilirubin Urine: NEGATIVE
Glucose, UA: NEGATIVE mg/dL
Ketones, ur: NEGATIVE mg/dL
Leukocytes,Ua: NEGATIVE
Nitrite: NEGATIVE
Protein, ur: NEGATIVE mg/dL
RBC / HPF: >50 RBC/hpf — ABNORMAL HIGH (ref 0–5)
Specific Gravity, Urine: 1.013 (ref 1.005–1.030)
pH: 5 (ref 5.0–8.0)

## 2020-05-10 LAB — CBC
HCT: 44.1 % (ref 39.0–52.0)
Hemoglobin: 14.3 g/dL (ref 13.0–17.0)
MCH: 26.3 pg (ref 26.0–34.0)
MCHC: 32.4 g/dL (ref 30.0–36.0)
MCV: 81.2 fL (ref 80.0–100.0)
Platelets: 237 10*3/uL (ref 150–400)
RBC: 5.43 MIL/uL (ref 4.22–5.81)
RDW: 13.8 % (ref 11.5–15.5)
WBC: 7.9 10*3/uL (ref 4.0–10.5)
nRBC: 0 % (ref 0.0–0.2)

## 2020-05-10 MED ORDER — ONDANSETRON HCL 4 MG/2ML IJ SOLN
4.0000 mg | Freq: Once | INTRAMUSCULAR | Status: AC
  Administered 2020-05-10: 4 mg via INTRAVENOUS
  Filled 2020-05-10: qty 2

## 2020-05-10 MED ORDER — SODIUM CHLORIDE 0.9 % IV BOLUS
500.0000 mL | Freq: Once | INTRAVENOUS | Status: AC
  Administered 2020-05-10: 500 mL via INTRAVENOUS

## 2020-05-10 MED ORDER — KETOROLAC TROMETHAMINE 30 MG/ML IJ SOLN
15.0000 mg | Freq: Once | INTRAMUSCULAR | Status: AC
  Administered 2020-05-10: 15 mg via INTRAVENOUS
  Filled 2020-05-10: qty 1

## 2020-05-10 MED ORDER — FENTANYL CITRATE (PF) 100 MCG/2ML IJ SOLN
50.0000 ug | INTRAMUSCULAR | Status: DC | PRN
  Administered 2020-05-10: 50 ug via INTRAVENOUS
  Filled 2020-05-10: qty 2

## 2020-05-10 MED ORDER — HYDROCODONE-ACETAMINOPHEN 5-325 MG PO TABS
1.0000 | ORAL_TABLET | Freq: Once | ORAL | Status: AC
  Administered 2020-05-10: 1 via ORAL
  Filled 2020-05-10: qty 1

## 2020-05-10 MED ORDER — LACTATED RINGERS IV BOLUS
1000.0000 mL | Freq: Once | INTRAVENOUS | Status: AC
  Administered 2020-05-10: 1000 mL via INTRAVENOUS

## 2020-05-10 MED ORDER — MORPHINE SULFATE (PF) 4 MG/ML IV SOLN
4.0000 mg | INTRAVENOUS | Status: DC | PRN
  Administered 2020-05-10: 4 mg via INTRAVENOUS
  Filled 2020-05-10: qty 1

## 2020-05-10 MED ORDER — HYDROCODONE-ACETAMINOPHEN 5-325 MG PO TABS: 1.0000 | ORAL_TABLET | Freq: Four times a day (QID) | ORAL | 0 refills | Status: AC | PRN

## 2020-05-10 NOTE — ED Triage Notes (Signed)
Patient arrived via POV with wife at side. Patient is AOx4 and ambulatory. Patient chief complaint is severe left flank pain with blood in urine. Patient had lithotripsy on September 2nd. Pain increased suddenly today. Patient is having Nausea and Vomiting at this time.

## 2020-05-10 NOTE — ED Notes (Signed)
Pt informed to hit call bell when he gives UA.

## 2020-05-10 NOTE — ED Provider Notes (Signed)
[facility] Emergency Department Provider Note    First MD Initiated Contact with Patient 05/10/20 2029     (approximate)  I have reviewed the triage vital signs and the nursing notes.   HISTORY  Chief Complaint Post-op Problem, Hematuria, and Nausea    HPI Nathan Burch is a 49 y.o. male history of nephrolithiasis status post recent lithotripsy presents to the ER for worsening left flank pain.  Not having fevers or dysuria.  Is still having some hematuria.  Reports the pain is moderate to severe associated with some nausea.    Past Medical History:  Diagnosis Date  . Hypertension   . Renal calculi 05/07/2020   Family History  Problem Relation Age of Onset  . Heart disease Mother   . Diabetes Mother   . Hypertension Mother   . Heart disease Father   . Hypertension Father    Past Surgical History:  Procedure Laterality Date  . CHOLECYSTECTOMY  06/02/2016   Procedure: LAPAROSCOPIC CHOLECYSTECTOMY;  Surgeon: Leafy Ro, MD;  Location: [facility] ORS;  Service: General;;  . EXTRACORPOREAL SHOCK WAVE LITHOTRIPSY Left 05/07/2020   Procedure: EXTRACORPOREAL SHOCK WAVE LITHOTRIPSY (ESWL);  Surgeon: Vanna Scotland, MD;  Location: [facility] ORS;  Service: Urology;  Laterality: Left;  . NO PAST SURGERIES     Patient Active Problem List   Diagnosis Date Noted  . S/P laparoscopic cholecystectomy 07/06/2016  . Postoperative fever   . Prediabetes 03/29/2015  . Morbid obesity with BMI of 50.0-59.9, adult (HCC) 12/05/2013  . Idiopathic hypertension 12/14/2012  . Hyperlipidemia 12/14/2012      Prior to Admission medications   Medication Sig Start Date End Date Taking? Authorizing Provider  atorvastatin (LIPITOR) 20 MG tablet Take 20 mg by mouth daily. 11/29/19   [provider]  HYDROcodone-acetaminophen (NORCO/VICODIN) 5-325 MG tablet Take 1-2 tablets by mouth every 6 (six) hours as needed for moderate pain. 05/07/20   Vanna Scotland, MD  meloxicam  (MOBIC) 15 MG tablet Take 15 mg by mouth daily as needed. 04/08/20   [provider]  Olmesartan-Amlodipine-HCTZ 40-10-25 MG TABS Take 1 tablet by mouth every morning.  12/09/15   [provider]  tamsulosin (FLOMAX) 0.4 MG CAPS capsule Take 1 capsule (0.4 mg total) by mouth daily. 05/07/20   Vanna Scotland, MD    Allergies Patient has no known allergies.    Social History Social History   Tobacco Use  . Smoking status: Never Smoker  . Smokeless tobacco: Never Used  Vaping Use  . Vaping Use: Never used  Substance Use Topics  . Alcohol use: No  . Drug use: No    Review of Systems Patient denies headaches, rhinorrhea, blurry vision, numbness, shortness of breath, chest pain, edema, cough, abdominal pain, nausea, vomiting, diarrhea, dysuria, fevers, rashes or hallucinations unless otherwise stated above in HPI. ____________________________________________   PHYSICAL EXAM:  VITAL SIGNS: Vitals:   05/10/20 2040 05/10/20 2130  BP: (!) 118/104   Pulse: 96 81  Resp:    Temp:    SpO2: 100% 97%    Constitutional: Alert and oriented.  Eyes: Conjunctivae are normal.  Head: Atraumatic. Nose: No congestion/rhinnorhea. Mouth/Throat: Mucous membranes are moist.   Neck: No stridor. Painless ROM.  Cardiovascular: Normal rate, regular rhythm. Grossly normal heart sounds.  Good peripheral circulation. Respiratory: Normal respiratory effort.  No retractions. Lungs CTAB. Gastrointestinal: Soft and nontender. No distention. No abdominal bruits. + left CVA tenderness. Genitourinary:  Musculoskeletal: No lower extremity tenderness nor edema.  No joint effusions. Neurologic:  Normal speech and language. No gross focal neurologic deficits are appreciated. No facial droop Skin:  Skin is warm, dry and intact. No rash noted. Psychiatric: Mood and affect are normal. Speech and behavior are normal.  ____________________________________________   LABS (all labs ordered are  listed, but only abnormal results are displayed)  Results for orders placed or performed during the hospital encounter of 05/10/20 (from the past 24 hour(s))  Basic metabolic panel     Status: Abnormal   Collection Time: 05/10/20  3:15 PM  Result Value Ref Range   Sodium 138 135 - 145 mmol/L   Potassium 3.4 (L) 3.5 - 5.1 mmol/L   Chloride 97 (L) 98 - 111 mmol/L   CO2 30 22 - 32 mmol/L   Glucose, Bld 108 (H) 70 - 99 mg/dL   BUN 17 6 - 20 mg/dL   Creatinine, Ser 8.31 (H) 0.61 - 1.24 mg/dL   Calcium 9.4 8.9 - 51.7 mg/dL   GFR calc non Af Amer >60 >60 mL/min   GFR calc Af Amer >60 >60 mL/min   Anion gap 11 5 - 15  CBC     Status: None   Collection Time: 05/10/20  3:15 PM  Result Value Ref Range   WBC 7.9 4.0 - 10.5 K/uL   RBC 5.43 4.22 - 5.81 MIL/uL   Hemoglobin 14.3 13.0 - 17.0 g/dL   HCT 61.6 39 - 52 %   MCV 81.2 80.0 - 100.0 fL   MCH 26.3 26.0 - 34.0 pg   MCHC 32.4 30.0 - 36.0 g/dL   RDW 07.3 71.0 - 62.6 %   Platelets 237 150 - 400 K/uL   nRBC 0.0 0.0 - 0.2 %   ____________________________________________  ____________________________________________  RADIOLOGY  I personally reviewed all radiographic images ordered to evaluate for the above acute complaints and reviewed radiology reports and findings.  These findings were personally discussed with the patient.  Please see medical record for radiology report.  ____________________________________________   PROCEDURES  Procedure(s) performed:  Procedures    Critical Care performed: no ____________________________________________   INITIAL IMPRESSION / ASSESSMENT AND PLAN / ED COURSE  Pertinent labs & imaging results that were available during my care of the patient were reviewed by me and considered in my medical decision making (see chart for details).   DDX: Stone, hematoma, pyelonephritis, cystitis, musculoskeletal strain  TYJON BOWEN is a 49 y.o. who presents to the ED with flank pain as described  above.  Afebrile hemodynamically stable.  Will give IV narcotic medication as the patient does appear uncomfortable.  Will order CT imaging as the patient's KUB does show evidence of persistent stone to evaluate for any evidence of post lithotripsy complication or failed lithotripsy.  Will check urine.  Will give IV fluids.  Clinical Course as of May 10 2337  Wynelle Link May 10, 2020  2304 Urine without any signs of infection.  He is afebrile no white count.  Pain is controlled at this point.  Does appear appropriate for outpatient follow-up.  Discussed return precautions.   [PR]    Clinical Course User Index [PR] Willy Eddy, MD    The patient was evaluated in Emergency Department today for the symptoms described in the history of present illness. He/she was evaluated in the context of the global COVID-19 pandemic, which necessitated consideration that the patient might be at risk for infection with the SARS-CoV-2 virus that causes COVID-19. Institutional protocols and algorithms that pertain to  the evaluation of patients at risk for COVID-19 are in a state of rapid change based on information released by regulatory bodies including the CDC and federal and state organizations. These policies and algorithms were followed during the patient's care in the ED.  As part of my medical decision making, I reviewed the following data within the electronic MEDICAL RECORD NUMBER Nursing notes reviewed and incorporated, Labs reviewed, notes from prior ED visits and [place] Controlled Substance Database   ____________________________________________   FINAL CLINICAL IMPRESSION(S) / ED DIAGNOSES  Final diagnoses:  Acute flank pain  Ureterolithiasis      NEW MEDICATIONS STARTED DURING THIS VISIT:  New Prescriptions   No medications on file     Note:  This document was prepared using Dragon voice recognition software and may include unintentional dictation errors.    Willy Eddy, MD 05/10/20  [phone]

## 2020-05-10 NOTE — ED Notes (Signed)
Pt given urinal- educated on use for sample.

## 2020-05-10 NOTE — ED Notes (Signed)
Pt taken to ct 

## 2020-05-12 ENCOUNTER — Other Ambulatory Visit: Payer: Self-pay

## 2020-05-12 ENCOUNTER — Emergency Department: Payer: BC Managed Care – PPO

## 2020-05-12 ENCOUNTER — Ambulatory Visit
Admission: EM | Admit: 2020-05-12 | Discharge: 2020-05-12 | Disposition: A | Discharge status: Home / Self Care | Payer: BC Managed Care – PPO | Attending: Emergency Medicine | Admitting: Emergency Medicine

## 2020-05-12 ENCOUNTER — Emergency Department: Payer: BC Managed Care – PPO | Admitting: Certified Registered"

## 2020-05-12 ENCOUNTER — Encounter
Admission: EM | Disposition: A | Discharge status: Home / Self Care | Payer: Self-pay | Source: Home / Self Care | Attending: Emergency Medicine

## 2020-05-12 DIAGNOSIS — I10 Essential (primary) hypertension: Secondary | ICD-10-CM | POA: Insufficient documentation

## 2020-05-12 DIAGNOSIS — N200 Calculus of kidney: Secondary | ICD-10-CM

## 2020-05-12 DIAGNOSIS — Z6841 Body Mass Index (BMI) 40.0 and over, adult: Secondary | ICD-10-CM | POA: Insufficient documentation

## 2020-05-12 DIAGNOSIS — N201 Calculus of ureter: Secondary | ICD-10-CM | POA: Diagnosis not present

## 2020-05-12 DIAGNOSIS — Z20822 Contact with and (suspected) exposure to covid-19: Secondary | ICD-10-CM | POA: Diagnosis not present

## 2020-05-12 DIAGNOSIS — N132 Hydronephrosis with renal and ureteral calculous obstruction: Secondary | ICD-10-CM | POA: Diagnosis not present

## 2020-05-12 DIAGNOSIS — Z79899 Other long term (current) drug therapy: Secondary | ICD-10-CM | POA: Insufficient documentation

## 2020-05-12 DIAGNOSIS — N202 Calculus of kidney with calculus of ureter: Secondary | ICD-10-CM

## 2020-05-12 HISTORY — PX: CYSTOSCOPY/URETEROSCOPY/HOLMIUM LASER/STENT PLACEMENT: SHX6546

## 2020-05-12 HISTORY — PX: CYSTOSCOPY W/ RETROGRADES: SHX1426

## 2020-05-12 LAB — CBC
HCT: 40.7 % (ref 39.0–52.0)
Hemoglobin: 13 g/dL (ref 13.0–17.0)
MCH: 26.2 pg (ref 26.0–34.0)
MCHC: 31.9 g/dL (ref 30.0–36.0)
MCV: 82.1 fL (ref 80.0–100.0)
Platelets: 208 10*3/uL (ref 150–400)
RBC: 4.96 MIL/uL (ref 4.22–5.81)
RDW: 13.5 % (ref 11.5–15.5)
WBC: 5.5 10*3/uL (ref 4.0–10.5)
nRBC: 0 % (ref 0.0–0.2)

## 2020-05-12 LAB — URINALYSIS, COMPLETE (UACMP) WITH MICROSCOPIC
Bacteria, UA: NONE SEEN
Bilirubin Urine: NEGATIVE
Glucose, UA: NEGATIVE mg/dL
Ketones, ur: NEGATIVE mg/dL
Leukocytes,Ua: NEGATIVE
Nitrite: NEGATIVE
Protein, ur: 30 mg/dL — AB
RBC / HPF: >50 RBC/hpf — ABNORMAL HIGH (ref 0–5)
Specific Gravity, Urine: 1.016 (ref 1.005–1.030)
Squamous Epithelial / HPF: NONE SEEN (ref 0–5)
pH: 6 (ref 5.0–8.0)

## 2020-05-12 LAB — BASIC METABOLIC PANEL
Anion gap: 10 (ref 5–15)
BUN: 15 mg/dL (ref 6–20)
CO2: 28 mmol/L (ref 22–32)
Calcium: 8.9 mg/dL (ref 8.9–10.3)
Chloride: 98 mmol/L (ref 98–111)
Creatinine, Ser: 1.29 mg/dL — ABNORMAL HIGH (ref 0.61–1.24)
GFR calc Af Amer: >60 mL/min (ref >60)
GFR calc non Af Amer: >60 mL/min (ref >60)
Glucose, Bld: 115 mg/dL — ABNORMAL HIGH (ref 70–99)
Potassium: 3.5 mmol/L (ref 3.5–5.1)
Sodium: 136 mmol/L (ref 135–145)

## 2020-05-12 LAB — SARS CORONAVIRUS 2 BY RT PCR (HOSPITAL ORDER, PERFORMED IN ~~LOC~~ HOSPITAL LAB): SARS Coronavirus 2: NEGATIVE

## 2020-05-12 SURGERY — CYSTOSCOPY/URETEROSCOPY/HOLMIUM LASER/STENT PLACEMENT
Anesthesia: General | Laterality: Left

## 2020-05-12 MED ORDER — LIDOCAINE HCL (CARDIAC) PF 100 MG/5ML IV SOSY
PREFILLED_SYRINGE | INTRAVENOUS | Status: DC | PRN
  Administered 2020-05-12: 100 mg via INTRAVENOUS

## 2020-05-12 MED ORDER — LIDOCAINE HCL (PF) 2 % IJ SOLN
INTRAMUSCULAR | Status: AC
  Filled 2020-05-12: qty 5

## 2020-05-12 MED ORDER — SEVOFLURANE IN SOLN
RESPIRATORY_TRACT | Status: AC
  Filled 2020-05-12: qty 250

## 2020-05-12 MED ORDER — FENTANYL CITRATE (PF) 100 MCG/2ML IJ SOLN
50.0000 ug | Freq: Once | INTRAMUSCULAR | Status: AC
  Administered 2020-05-12: 50 ug via INTRAVENOUS
  Filled 2020-05-12: qty 2

## 2020-05-12 MED ORDER — MORPHINE SULFATE (PF) 4 MG/ML IV SOLN
6.0000 mg | Freq: Once | INTRAVENOUS | Status: AC
  Administered 2020-05-12: 6 mg via INTRAVENOUS
  Filled 2020-05-12: qty 2

## 2020-05-12 MED ORDER — SUCCINYLCHOLINE 20MG/ML (10ML) SYRINGE FOR MEDFUSION PUMP - OPTIME
INTRAMUSCULAR | Status: DC | PRN
  Administered 2020-05-12 (×2): 140 mg via INTRAVENOUS

## 2020-05-12 MED ORDER — SODIUM CHLORIDE 0.9 % IV SOLN
INTRAVENOUS | Status: DC | PRN
  Administered 2020-05-12: 100 ug via INTRAVENOUS
  Administered 2020-05-12 (×4): 200 ug via INTRAVENOUS
  Administered 2020-05-12: 100 ug via INTRAVENOUS

## 2020-05-12 MED ORDER — SODIUM CHLORIDE 0.9 % IV BOLUS
1000.0000 mL | Freq: Once | INTRAVENOUS | Status: AC
  Administered 2020-05-12: 1000 mL via INTRAVENOUS

## 2020-05-12 MED ORDER — GLYCOPYRROLATE 0.2 MG/ML IJ SOLN
INTRAMUSCULAR | Status: DC | PRN
  Administered 2020-05-12: .2 mg via INTRAVENOUS

## 2020-05-12 MED ORDER — ROCURONIUM BROMIDE 100 MG/10ML IV SOLN
INTRAVENOUS | Status: DC | PRN
  Administered 2020-05-12: 5 mg via INTRAVENOUS

## 2020-05-12 MED ORDER — MIDAZOLAM HCL 2 MG/2ML IJ SOLN
INTRAMUSCULAR | Status: DC | PRN
  Administered 2020-05-12: 2 mg via INTRAVENOUS

## 2020-05-12 MED ORDER — ONDANSETRON HCL 4 MG/2ML IJ SOLN
4.0000 mg | Freq: Once | INTRAMUSCULAR | Status: AC
  Administered 2020-05-12: 4 mg via INTRAVENOUS
  Filled 2020-05-12: qty 2

## 2020-05-12 MED ORDER — SODIUM CHLORIDE 0.9 % IV SOLN
INTRAVENOUS | Status: DC | PRN
  Administered 2020-05-12: 100 ug/min via INTRAVENOUS

## 2020-05-12 MED ORDER — ONDANSETRON HCL 4 MG/2ML IJ SOLN: 4.0000 mg | Freq: Once | INTRAMUSCULAR | Status: DC | PRN

## 2020-05-12 MED ORDER — ROCURONIUM BROMIDE 10 MG/ML (PF) SYRINGE
PREFILLED_SYRINGE | INTRAVENOUS | Status: AC
  Filled 2020-05-12: qty 10

## 2020-05-12 MED ORDER — SODIUM CHLORIDE 0.9 % IV BOLUS
500.0000 mL | Freq: Once | INTRAVENOUS | Status: AC
  Administered 2020-05-12: 500 mL via INTRAVENOUS

## 2020-05-12 MED ORDER — PROPOFOL 10 MG/ML IV BOLUS
INTRAVENOUS | Status: AC
  Filled 2020-05-12: qty 20

## 2020-05-12 MED ORDER — ONDANSETRON HCL 4 MG/2ML IJ SOLN
INTRAMUSCULAR | Status: AC
  Filled 2020-05-12: qty 2

## 2020-05-12 MED ORDER — DEXAMETHASONE SODIUM PHOSPHATE 10 MG/ML IJ SOLN
INTRAMUSCULAR | Status: DC | PRN
  Administered 2020-05-12: 10 mg via INTRAVENOUS

## 2020-05-12 MED ORDER — EPHEDRINE SULFATE 50 MG/ML IJ SOLN
INTRAMUSCULAR | Status: DC | PRN
  Administered 2020-05-12 (×2): 10 mg via INTRAVENOUS

## 2020-05-12 MED ORDER — FENTANYL CITRATE (PF) 100 MCG/2ML IJ SOLN
INTRAMUSCULAR | Status: AC
  Filled 2020-05-12: qty 2

## 2020-05-12 MED ORDER — LACTATED RINGERS IV SOLN: INTRAVENOUS | Status: DC | PRN

## 2020-05-12 MED ORDER — FENTANYL CITRATE (PF) 100 MCG/2ML IJ SOLN
INTRAMUSCULAR | Status: DC | PRN
Start: 2020-05-12 — End: 2020-05-12
  Administered 2020-05-12: 100 ug via INTRAVENOUS

## 2020-05-12 MED ORDER — MIDAZOLAM HCL 2 MG/2ML IJ SOLN
INTRAMUSCULAR | Status: AC
  Filled 2020-05-12: qty 2

## 2020-05-12 MED ORDER — PROPOFOL 10 MG/ML IV BOLUS
INTRAVENOUS | Status: DC | PRN
  Administered 2020-05-12: 200 mg via INTRAVENOUS
  Administered 2020-05-12: 100 mg via INTRAVENOUS

## 2020-05-12 MED ORDER — ONDANSETRON HCL 4 MG/2ML IJ SOLN
INTRAMUSCULAR | Status: DC | PRN
  Administered 2020-05-12: 4 mg via INTRAVENOUS

## 2020-05-12 MED ORDER — MORPHINE SULFATE (PF) 4 MG/ML IV SOLN
4.0000 mg | Freq: Once | INTRAVENOUS | Status: AC
  Administered 2020-05-12: 4 mg via INTRAVENOUS
  Filled 2020-05-12: qty 1

## 2020-05-12 MED ORDER — OXYBUTYNIN CHLORIDE 5 MG PO TABS: ORAL_TABLET | ORAL | 0 refills | Status: AC

## 2020-05-12 MED ORDER — SUCCINYLCHOLINE CHLORIDE 200 MG/10ML IV SOSY
PREFILLED_SYRINGE | INTRAVENOUS | Status: AC
  Filled 2020-05-12: qty 20

## 2020-05-12 MED ORDER — FENTANYL CITRATE (PF) 100 MCG/2ML IJ SOLN: 25.0000 ug | INTRAMUSCULAR | Status: DC | PRN

## 2020-05-12 MED ORDER — DEXTROSE 5 % IV SOLN
3.0000 g | INTRAVENOUS | Status: AC
  Administered 2020-05-12: 3 g via INTRAVENOUS
  Filled 2020-05-12: qty 3

## 2020-05-12 SURGICAL SUPPLY — 31 items
BAG DRAIN CYSTO-URO LG1000N (MISCELLANEOUS) ×3 IMPLANT
BASKET ZERO TIP 1.9FR (BASKET) ×6 IMPLANT
BRUSH SCRUB EZ 1% IODOPHOR (MISCELLANEOUS) ×3 IMPLANT
CATH ROBINSON RED A/P 16FR (CATHETERS) ×3 IMPLANT
CATH URETL 5X70 OPEN END (CATHETERS) ×3 IMPLANT
CNTNR SPEC 2.5X3XGRAD LEK (MISCELLANEOUS) ×1
CONT SPEC 4OZ STER OR WHT (MISCELLANEOUS) ×2
CONTAINER SPEC 2.5X3XGRAD LEK (MISCELLANEOUS) ×1 IMPLANT
DRAPE UTILITY 15X26 TOWEL STRL (DRAPES) ×3 IMPLANT
FIBER LASER TRACTIP 200 (UROLOGICAL SUPPLIES) ×3 IMPLANT
GLOVE BIOGEL PI IND STRL 7.5 (GLOVE) ×1 IMPLANT
GLOVE BIOGEL PI INDICATOR 7.5 (GLOVE) ×2
GOWN STRL REUS W/ TWL LRG LVL3 (GOWN DISPOSABLE) ×1 IMPLANT
GOWN STRL REUS W/ TWL XL LVL3 (GOWN DISPOSABLE) ×1 IMPLANT
GOWN STRL REUS W/TWL LRG LVL3 (GOWN DISPOSABLE) ×2
GOWN STRL REUS W/TWL XL LVL3 (GOWN DISPOSABLE) ×2
GUIDEWIRE STR DUAL SENSOR (WIRE) ×3 IMPLANT
INFUSOR MANOMETER BAG 3000ML (MISCELLANEOUS) ×6 IMPLANT
INTRODUCER DILATOR DOUBLE (INTRODUCER) IMPLANT
KIT TURNOVER CYSTO (KITS) ×3 IMPLANT
PACK CYSTO AR (MISCELLANEOUS) ×3 IMPLANT
SET CYSTO W/LG BORE CLAMP LF (SET/KITS/TRAYS/PACK) ×3 IMPLANT
SHEATH URETERAL 12FRX35CM (MISCELLANEOUS) IMPLANT
SOL .9 NS 3000ML IRR  AL (IV SOLUTION) ×4
SOL .9 NS 3000ML IRR UROMATIC (IV SOLUTION) ×2 IMPLANT
STENT URET 6FRX24 CONTOUR (STENTS) IMPLANT
STENT URET 6FRX26 CONTOUR (STENTS) ×3 IMPLANT
STENT URET 6FRX26 FIRM (STENTS) ×3 IMPLANT
SURGILUBE 2OZ TUBE FLIPTOP (MISCELLANEOUS) ×3 IMPLANT
VALVE UROSEAL ADJ ENDO (VALVE) ×3 IMPLANT
WATER STERILE IRR 1000ML POUR (IV SOLUTION) ×3 IMPLANT

## 2020-05-12 NOTE — Transfer of Care (Signed)
Immediate Anesthesia Transfer of Care Note  Patient: Nathan Burch  Procedure(s) Performed: CYSTOSCOPY/URETEROSCOPY/HOLMIUM LASER/STENT PLACEMENT (Left ) CYSTOSCOPY WITH RETROGRADE PYELOGRAM (Left )  Patient Location: PACU  Anesthesia Type:General  Level of Consciousness: drowsy  Airway & Oxygen Therapy: Patient Spontanous Breathing and Patient connected to face mask oxygen  Post-op Assessment: Report given to RN  Post vital signs: stable  Last Vitals:  Vitals Value Taken Time  BP 120/84 05/12/20 1730  Temp    Pulse 71 05/12/20 1732  Resp 13 05/12/20 1732  SpO2 98 % 05/12/20 1732  Vitals shown include unvalidated device data.  Last Pain:  Vitals:   05/12/20 1331  TempSrc: Temporal  PainSc:          Complications: No complications documented.

## 2020-05-12 NOTE — OR Nursing (Signed)
Patient straight cath with 16 fr all purpose urethra catheter at end of procedure, 900 mls out of hematuria by Dr. Lonna Cobb.

## 2020-05-12 NOTE — Anesthesia Procedure Notes (Signed)
Procedure Name: Intubation Performed by: Rona Ravens, CRNA Pre-anesthesia Checklist: Patient identified, Emergency Drugs available, Suction available, Patient being monitored and Timeout performed Patient Re-evaluated:Patient Re-evaluated prior to induction Oxygen Delivery Method: Circle system utilized Preoxygenation: Pre-oxygenation with 100% oxygen Induction Type: IV induction Ventilation: Two handed mask ventilation required, Mask ventilation with difficulty and Oral airway inserted - appropriate to patient size Tube size: 8.0 mm Number of attempts: 3 Airway Equipment and Method: Stylet,  Patient positioned with wedge pillow and Video-laryngoscopy Placement Confirmation: ETT inserted through vocal cords under direct vision,  CO2 detector,  breath sounds checked- equal and bilateral and positive ETCO2 Secured at: 24 cm Tube secured with: Tape Difficulty Due To: Difficult Airway- due to anterior larynx, Difficult Airway- due to limited oral opening, Difficult Airway- due to large tongue and Difficulty was anticipated Future Recommendations: Recommend- induction with short-acting agent, and alternative techniques readily available and Recommend- awake intubation Comments: 1st attempt with MAC 4 base of the vocal cords only with lots of cricoid pressure.  Mask ventilated, oral airway placed.  Mcgrath #4 grade I view, anterior airway unable to place ETT or bougie.  Mask ventilated, additional propofol and succincycholine given.   Glidescope #4 (curved)  With flexible bougie difficult but successful.

## 2020-05-12 NOTE — Discharge Instructions (Signed)
AMBULATORY SURGERY  DISCHARGE INSTRUCTIONS   1) The drugs that you were given will stay in your system until tomorrow so for the next 24 hours you should not:  A) Drive an automobile B) Make any legal decisions C) Drink any alcoholic beverage   2) You may resume regular meals tomorrow.  Today it is better to start with liquids and gradually work up to solid foods.  You may eat anything you prefer, but it is better to start with liquids, then soup and crackers, and gradually work up to solid foods.   3) Please notify your doctor immediately if you have any unusual bleeding, trouble breathing, redness and pain at the surgery site, drainage, fever, or pain not relieved by medication.    4) Additional Instructions:   DISCHARGE INSTRUCTIONS FOR KIDNEY STONE/URETERAL STENT   MEDICATIONS:  1. Resume all your other meds from home.  2.  AZO (over-the-counter) can help with the burning/stinging when you urinate. 3.  Oxybutynin is for bladder irritation, Rx was sent to pharmacy  ACTIVITY:  1. May resume regular activities in 24 hours. 2. No driving while on narcotic pain medications  3. Drink plenty of water  4. Continue to walk at home - you can still get blood clots when you are at home, so keep active, but don't over do it.  5. May return to work/school tomorrow or when you feel ready   BATHING:  1. You can shower.Marland Kitchen   SIGNS/SYMPTOMS TO CALL:  Please call us if you have a fever greater than 101.5, uncontrolled nausea/vomiting, uncontrolled pain, dizziness, unable to urinate, bloody urine, chest pain, shortness of breath, leg swelling, leg pain, or any other concerns or questions.         Common postoperative symptoms include urinary frequency, urgency, burning, blood in the urine and bladder spasm  You can reach [place] at [phone].   FOLLOW-UP:  1. You have will be contacted by our office for appointment to have your stent removed in approximately 1 week.       Please  contact your physician with any problems or Same Day Surgery at [phone], Monday through Friday 6 am to 4 pm, or [place] at [facility] number at [phone].

## 2020-05-12 NOTE — ED Notes (Addendum)
Pt undressed and in gown, ready for OR. All pt belongings placed in bag, will give to wife when she visits.

## 2020-05-12 NOTE — Anesthesia Postprocedure Evaluation (Signed)
Anesthesia Post Note  Patient: Nathan Burch  Procedure(s) Performed: CYSTOSCOPY/URETEROSCOPY/HOLMIUM LASER/STENT PLACEMENT (Left ) CYSTOSCOPY WITH RETROGRADE PYELOGRAM (Left )  Patient location during evaluation: PACU Anesthesia Type: General Level of consciousness: awake and alert Pain management: pain level controlled Vital Signs Assessment: post-procedure vital signs reviewed and stable Respiratory status: spontaneous breathing, nonlabored ventilation, respiratory function stable and patient connected to nasal cannula oxygen Cardiovascular status: blood pressure returned to baseline and stable Postop Assessment: no apparent nausea or vomiting Anesthetic complications: no   No complications documented.   Last Vitals:  Vitals:   05/12/20 1815 05/12/20 1831  BP: 120/82 117/67  Pulse: 89 87  Resp: 17 18  Temp:  (!) 36.1 C  SpO2: 94% 94%    Last Pain:  Vitals:   05/12/20 1831  TempSrc: Tympanic  PainSc: 0-No pain                 Cleda Mccreedy Josepha Barbier

## 2020-05-12 NOTE — ED Provider Notes (Signed)
[facility] Emergency Department Provider Note  ____________________________________________   First MD Initiated Contact with Patient 05/12/20 [phone]     (approximate)  I have reviewed the triage vital signs and the nursing notes.   HISTORY  Chief Complaint Emesis and Flank Pain    HPI PRATEEK KNIPPLE is a 49 y.o. male here for evaluation of left-sided abdominal pain.  Patient reports same pain from his kidney stones.  He recently had stones "blasted" but is having ongoing pain.  He is vomited 3 times only able to eat a little bit yesterday.  Has not had anything at all to eat or drink since 7 PM last night  Feels fatigued.  Severe pain despite use of hydrocodone over his left flank left back.  Same location as previous kidney stones  No fevers or chills.  Positive for nausea and vomiting.  No known Covid exposure  Recently saw urology and had procedure done was told he may need further treatment.  Attempted to call urology office yesterday  Past Medical History:  Diagnosis Date  . Hypertension   . Renal calculi 05/07/2020    Patient Active Problem List   Diagnosis Date Noted  . S/P laparoscopic cholecystectomy 07/06/2016  . Postoperative fever   . Prediabetes 03/29/2015  . Morbid obesity with BMI of 50.0-59.9, adult (HCC) 12/05/2013  . Idiopathic hypertension 12/14/2012  . Hyperlipidemia 12/14/2012    Past Surgical History:  Procedure Laterality Date  . CHOLECYSTECTOMY  06/02/2016   Procedure: LAPAROSCOPIC CHOLECYSTECTOMY;  Surgeon: Leafy Ro, MD;  Location: [facility] ORS;  Service: General;;  . EXTRACORPOREAL SHOCK WAVE LITHOTRIPSY Left 05/07/2020   Procedure: EXTRACORPOREAL SHOCK WAVE LITHOTRIPSY (ESWL);  Surgeon: Vanna Scotland, MD;  Location: [facility] ORS;  Service: Urology;  Laterality: Left;  . NO PAST SURGERIES      Prior to Admission medications   Medication Sig Start Date End Date Taking? Authorizing Provider  atorvastatin (LIPITOR)  20 MG tablet Take 20 mg by mouth daily. 11/29/19   [provider]  HYDROcodone-acetaminophen (NORCO/VICODIN) 5-325 MG tablet Take 1-2 tablets by mouth every 6 (six) hours as needed for moderate pain. 05/10/20   Willy Eddy, MD  meloxicam (MOBIC) 15 MG tablet Take 15 mg by mouth daily as needed. 04/08/20   [provider]  Olmesartan-Amlodipine-HCTZ 40-10-25 MG TABS Take 1 tablet by mouth every morning.  12/09/15   [provider]  tamsulosin (FLOMAX) 0.4 MG CAPS capsule Take 1 capsule (0.4 mg total) by mouth daily. 05/07/20   Vanna Scotland, MD    Allergies Patient has no known allergies.  Family History  Problem Relation Age of Onset  . Heart disease Mother   . Diabetes Mother   . Hypertension Mother   . Heart disease Father   . Hypertension Father     Social History Social History   Tobacco Use  . Smoking status: Never Smoker  . Smokeless tobacco: Never Used  Vaping Use  . Vaping Use: Never used  Substance Use Topics  . Alcohol use: No  . Drug use: No    Review of Systems Constitutional: No fever/chills Eyes: No visual changes. ENT: No sore throat. Cardiovascular: Denies chest pain. Respiratory: Denies shortness of breath. Gastrointestinal: See HPI.  Denies new abdominal pain pain located left flank same as previous area Genitourinary: Just some very slight burning at times when he urinates.  No abnormal odor. Musculoskeletal: Negative for back pain except along the left flank. Skin: Negative for rash. Neurological: Negative  for headaches, areas of focal weakness or numbness.    ____________________________________________   PHYSICAL EXAM:  VITAL SIGNS: ED Triage Vitals [05/12/20 0034]  Enc Vitals Group     BP (!) 149/99     Pulse Rate 100     Resp 20     Temp 98.3 F (36.8 C)     Temp Source Oral     SpO2 100 %     Weight (!) 380 lb (172.4 kg)     Height 5\' 11"  (1.803 m)     Head Circumference      Peak Flow      Pain Score  10     Pain Loc      Pain Edu?      Excl. in GC?     Constitutional: Alert and oriented. Well appearing and in no acute distress except he does appear in pain holding his hand over his left flank appearing like he cannot get comfortable. Eyes: Conjunctivae are normal. Head: Atraumatic. Nose: No congestion/rhinnorhea. Mouth/Throat: Mucous membranes are slightly dry. Neck: No stridor.  Cardiovascular: Normal rate, regular rhythm. Grossly normal heart sounds.  Good peripheral circulation. Respiratory: Normal respiratory effort.  No retractions. Lungs CTAB. Gastrointestinal: Soft and nontender. No distention.  Does report moderate pain to left sided CVA percussion. Musculoskeletal: No lower extremity tenderness nor edema. Neurologic:  Normal speech and language. No gross focal neurologic deficits are appreciated.  Skin:  Skin is warm, dry and intact. No rash noted. Psychiatric: Mood and affect are normal. Speech and behavior are normal.  ____________________________________________   LABS (all labs ordered are listed, but only abnormal results are displayed)  Labs Reviewed  URINALYSIS, COMPLETE (UACMP) WITH MICROSCOPIC - Abnormal; Notable for the following components:      Result Value   Color, Urine YELLOW (*)    APPearance HAZY (*)    Hgb urine dipstick LARGE (*)    Protein, ur 30 (*)    RBC / HPF >50 (*)    All other components within normal limits  BASIC METABOLIC PANEL - Abnormal; Notable for the following components:   Glucose, Bld 115 (*)    Creatinine, Ser 1.29 (*)    All other components within normal limits  SARS CORONAVIRUS 2 BY RT PCR (HOSPITAL ORDER, PERFORMED IN [facility])  CBC   ____________________________________________  EKG   ____________________________________________  RADIOLOGY  DG Abdomen 1 View  Result Date: 05/12/2020 CLINICAL DATA:  Constipation EXAM: ABDOMEN - 1 VIEW COMPARISON:  CT 05/10/2020 FINDINGS: Left lower pole stone.  Calcifications again seen in the left lower abdomen, likely multiple proximal to mid left ureteral stones as seen on prior CT. Nonobstructive bowel gas pattern. No organomegaly. Prior cholecystectomy. IMPRESSION: Left lower pole nephrolithiasis. Probable multiple proximal to mid left ureteral stones as seen on prior CT. Electronically Signed   By: 07/10/2020 M.D.   On: 05/12/2020 01:26    Also reviewed his CT scan from 05/10/2020, discussed this with Dr. 07/10/2020 of urology ____________________________________________   PROCEDURES  Procedure(s) performed: None  Procedures  Critical Care performed: No  ____________________________________________   INITIAL IMPRESSION / ASSESSMENT AND PLAN / ED COURSE  Pertinent labs & imaging results that were available during my care of the patient were reviewed by me and considered in my medical decision making (see chart for details).   Differential diagnosis includes but is not limited to, abdominal perforation, aortic dissection, cholecystitis, appendicitis, diverticulitis, colitis, esophagitis/gastritis, kidney stone, pyelonephritis, urinary tract infection, aortic  aneurysm. All are considered in decision and treatment plan. Based upon the patient's presentation and risk factors, suspect his symptoms represent ongoing pain from nephrolithiasis after lithotripsy.  He does not have evidence of acute abdomen.  His white count is normal his urinalysis reassuring with only hematuria noted.  I do not believe he has an acutely infected stone.  Clinical presentation and symptoms seem most consistent with ongoing neurologic symptoms   Clinical Course as of May 12 840  Tue May 12, 2020  5176 Pain is improved.  Discussed case with Dr. Lonna Cobb, he is going to have his PA evaluate the patient.  Recommend keep n.p.o. and tentatively planning for OR evaluation with urology for further treatment for concerns of ongoing nephrolithiasis.  Patient understanding this  plan, remaining n.p.o.  Receiving IV fluids.  Pain controlled.   [MQ]    Clinical Course User Index [MQ] Sharyn Creamer, MD    ELDREDGE VELDHUIZEN was evaluated in Emergency Department on 05/12/2020 for the symptoms described in the history of present illness. He was evaluated in the context of the global COVID-19 pandemic, which necessitated consideration that the patient might be at risk for infection with the SARS-CoV-2 virus that causes COVID-19. Institutional protocols and algorithms that pertain to the evaluation of patients at risk for COVID-19 are in a state of rapid change based on information released by regulatory bodies including the CDC and federal and state organizations. These policies and algorithms were followed during the patient's care in the ED.  ----------------------------------------- 11:42 AM on 05/12/2020 -----------------------------------------  Patient awaiting transfer to OR for evaluation and consultation with Dr. Lonna Cobb.  Patient has continued to receive medication for pain relief.  In no distress, ____________________________________________   FINAL CLINICAL IMPRESSION(S) / ED DIAGNOSES  Final diagnoses:  Left nephrolithiasis        Note:  This document was prepared using Dragon voice recognition software and may include unintentional dictation errors       Sharyn Creamer, MD 05/12/20 1143

## 2020-05-12 NOTE — Consult Note (Signed)
Urology Consult  Requesting physician: Dr. Fanny Bien  Reason for consultation: Obstructing ureteral fragments with renal colic  Chief Complaint: Kidney stone  History of Present Illness: Nathan Burch is a 49 y.o. male previously seen by Dr. Richardo Hanks 04/30/2020 for left renal colic with CT showing a 9 mm left renal pelvic calculus.  Stone density was 1100 HU and 15 cm SSD.  He desires shockwave lithotripsy which was not recommended due to stone density and SSD.  He underwent ESWL by Dr. Apolinar Junes on 05/07/2020 with the stone appearing smudged at the end of the procedure.  He presented to the ED 05/10/2020 complaining of left flank pain.  No fever or chills.  CT was performed which showed 3 stone fragments in the left proximal ureter, largest measuring 4 mm with moderate hydronephrosis.  There was also a 6 mm calculus noted in the renal pelvis with additional smaller fragments in the renal pelvis.  Pain was controlled with p.o. analgesics and he was discharged from the ED however returned this morning with persistent left flank pain and nausea, vomiting.  No relief with hydrocodone.  Denies fever, chills.  Urinalysis with RBCs and no WBCs.  Due to persistent pain he has elected left ureteral stent placement with an attempt at left ureteroscopy and treatment/removal of his stone fragments.  Past Medical History:  Diagnosis Date  . Hypertension   . Renal calculi 05/07/2020    Past Surgical History:  Procedure Laterality Date  . CHOLECYSTECTOMY  06/02/2016   Procedure: LAPAROSCOPIC CHOLECYSTECTOMY;  Surgeon: Leafy Ro, MD;  Location: [facility] ORS;  Service: General;;  . EXTRACORPOREAL SHOCK WAVE LITHOTRIPSY Left 05/07/2020   Procedure: EXTRACORPOREAL SHOCK WAVE LITHOTRIPSY (ESWL);  Surgeon: Vanna Scotland, MD;  Location: [facility] ORS;  Service: Urology;  Laterality: Left;  . NO PAST SURGERIES      Home Medications:  No outpatient medications have been marked as taking for the 05/12/20 encounter  [facility] Encounter).    Allergies: No Known Allergies  Family History  Problem Relation Age of Onset  . Heart disease Mother   . Diabetes Mother   . Hypertension Mother   . Heart disease Father   . Hypertension Father     Social History:  reports that he has never smoked. He has never used smokeless tobacco. He reports that he does not drink alcohol and does not use drugs.  ROS: A complete review of systems was performed.  All systems are negative except for pertinent findings as noted.  Physical Exam:  Vital signs in last 24 hours: Temp:  [97 F (36.1 C)-98.3 F (36.8 C)] 97 F (36.1 C) (09/07 1331) Pulse Rate:  [75-101] 87 (09/07 1331) Resp:  [18-20] 18 (09/07 1331) BP: (136-175)/(84-105) 136/84 (09/07 1331) SpO2:  [96 %-100 %] 98 % (09/07 1331) Weight:  [172.4 kg] 172.4 kg (09/07 0034) Constitutional:  Alert and oriented, No acute distress HEENT: [place] AT, moist mucus membranes.  Trachea midline, no masses Cardiovascular: Regular rate and rhythm, no clubbing, cyanosis, or edema. Respiratory: Normal respiratory effort, lungs clear bilaterally GI: Abdomen is soft, nontender, nondistended, no abdominal masses GU: No CVA tenderness Skin: No rashes, bruises or suspicious lesions Lymph: No cervical or inguinal adenopathy Neurologic: Grossly intact, no focal deficits, moving all 4 extremities Psychiatric: Normal mood and affect   Laboratory Data:  Recent Labs    05/10/20 1515 05/12/20 0039  WBC 7.9 5.5  HGB 14.3 13.0  HCT 44.1 40.7   Recent Labs  05/10/20 1515 05/12/20 0153  NA 138 136  K 3.4* 3.5  CL 97* 98  CO2 30 28  GLUCOSE 108* 115*  BUN 17 15  CREATININE 1.34* 1.29*  CALCIUM 9.4 8.9   No results for input(s): LABPT, INR in the last 72 hours. No results for input(s): LABURIN in the last 72 hours. Results for orders placed or performed during the hospital encounter of 05/12/20  SARS Coronavirus 2 by RT PCR (hospital order, performed in [facility]  hospital lab) Nasopharyngeal Nasopharyngeal Swab     Status: None   Collection Time: 05/12/20  7:31 AM   Specimen: Nasopharyngeal Swab  Result Value Ref Range Status   SARS Coronavirus 2 NEGATIVE NEGATIVE Final    Comment: (NOTE) SARS-CoV-2 target nucleic acids are NOT DETECTED.  The SARS-CoV-2 RNA is generally detectable in upper and lower respiratory specimens during the acute phase of infection. The lowest concentration of SARS-CoV-2 viral copies this assay can detect is 250 copies / mL. A negative result does not preclude SARS-CoV-2 infection and should not be used as the sole basis for treatment or other patient management decisions.  A negative result may occur with improper specimen collection / handling, submission of specimen other than nasopharyngeal swab, presence of viral mutation(s) within the areas targeted by this assay, and inadequate number of viral copies (<250 copies / mL). A negative result must be combined with clinical observations, patient history, and epidemiological information.  Fact Sheet for Patients:   BoilerBrush.com.cy  Fact Sheet for Healthcare Providers: https://pope.com/  This test is not yet approved or  cleared by the [place] FDA and has been authorized for detection and/or diagnosis of SARS-CoV-2 by FDA under an Emergency Use Authorization (EUA).  This EUA will remain in effect (meaning this test can be used) for the duration of the COVID-19 declaration under Section 564(b)(1) of the Act, 21 U.S.C. section 360bbb-3(b)(1), unless the authorization is terminated or revoked sooner.  Performed at [facility], [address] [place], [place] [address]      Radiologic Imaging: CT/KUBs were personally reviewed DG Abdomen 1 View  Result Date: 05/12/2020 CLINICAL DATA:  Constipation EXAM: ABDOMEN - 1 VIEW COMPARISON:  CT 05/10/2020 FINDINGS: Left lower pole stone. Calcifications  again seen in the left lower abdomen, likely multiple proximal to mid left ureteral stones as seen on prior CT. Nonobstructive bowel gas pattern. No organomegaly. Prior cholecystectomy. IMPRESSION: Left lower pole nephrolithiasis. Probable multiple proximal to mid left ureteral stones as seen on prior CT. Electronically Signed   By: Charlett Nose M.D.   On: 05/12/2020 01:26   DG Abdomen 1 View  Result Date: 05/10/2020 CLINICAL DATA:  Left flank pain recent lithotripsy EXAM: ABDOMEN - 1 VIEW COMPARISON:  05/07/2020 FINDINGS: The previously noted calculus overlying the interpolar region of the left kidney is again identified though appears fainter, measuring 11 mm in greatest dimension. There are, however, to additional calculi now identified in the expected location of the left ureteropelvic junction measuring 9 mm and 4 mm in greatest dimension. No definite right-sided nephro or urolithiasis. Normal abdominal gas pattern. IMPRESSION: Interval development of left UPJ calculi, as above. This could be confirmed with CT imaging, if indicated. Electronically Signed   By: Helyn Numbers MD   On: 05/10/2020 17:06   CT Renal Stone Study  Result Date: 05/10/2020 CLINICAL DATA:  Flank pain. EXAM: CT ABDOMEN AND PELVIS WITHOUT CONTRAST TECHNIQUE: Multidetector CT imaging of the abdomen and pelvis was performed following the  standard protocol without IV contrast. COMPARISON:  CT abdomen dated 04/25/2020. FINDINGS: Lower chest: 3 mm pulmonary nodule within the RIGHT lower lobe. Lung bases otherwise clear. Hepatobiliary: No focal liver abnormality is seen. Status post cholecystectomy. No biliary dilatation. Pancreas: Unremarkable. No pancreatic ductal dilatation or surrounding inflammatory changes. Spleen: Normal in size without focal abnormality. Adrenals/Urinary Tract: 3 separate stones within the proximal LEFT ureter, largest measuring 4 mm. Moderate LEFT-sided hydronephrosis. Additional 6 mm stone within the LEFT renal  pelvis. Additional 1 mm and 5 mm stones within the LEFT renal pelvis. RIGHT kidney is unremarkable without stone or hydronephrosis. Bladder is unremarkable, partially decompressed. Stomach/Bowel: No dilated large or small bowel loops. No evidence of bowel wall inflammation. Vascular/Lymphatic: No significant vascular findings are present. No enlarged abdominal or pelvic lymph nodes. Reproductive: Prostate is unremarkable. Other: No abscess collection.  No free intraperitoneal air. Musculoskeletal: No acute or suspicious osseous finding. Midline abdominal wall diastasis recti and small fat containing umbilical hernia. IMPRESSION: 1. 3 separate stones within the proximal LEFT ureter, largest measuring 4 mm, causing moderate LEFT-sided hydronephrosis. 2. Additional LEFT nephrolithiasis, as detailed above. 3. 3 mm pulmonary nodule within the RIGHT lower lobe. No follow-up needed if patient is low-risk. Non-contrast chest CT can be considered in 12 months if patient is high-risk. This recommendation follows the consensus statement: Guidelines for Management of Incidental Pulmonary Nodules Detected on CT Images: From the Fleischner Society 2017; Radiology 2017; 284:228-243. 4. Midline abdominal wall diastasis recti and small fat containing umbilical hernia. Electronically Signed   By: Bary Richard M.D.   On: 05/10/2020 21:09    Impression/Assessment:  49 y.o. male status post shockwave lithotripsy of a 9 mm left renal pelvic calculus with persistent renal colic secondary to obstructing stone fragments.  Plan:   Cystoscopy with placement of left ureteral stent  Since there is no evidence of infection will also plan left ureteroscopy and treatment of his ureteral fragments with laser lithotripsy.  The procedure was discussed in detail clean potential risks of bleeding, infection, ureteral injury/perforation.  There was also discussed the possibility that the fragments may not be able to be treated due to  inability to access the upper ureter and renal pelvis.  In this event only the stent will be placed and he will need follow-up definitive stone treatment in the future.  He indicated all questions were answered and desires to proceed.   05/12/2020, 1:33 PM  Irineo Axon,  MD

## 2020-05-12 NOTE — ED Triage Notes (Signed)
PT to ED via POV, pt has known kidney stone, was d/c with medications to pass stone at home. PT has not passed stone, has vomited x3 today and has severe flank pain PT also complaining he has not had a BM since Sunday

## 2020-05-12 NOTE — ED Notes (Signed)
Pt states pain meds helped until he moved for xray. Messaged MD regarding pain.

## 2020-05-12 NOTE — ED Notes (Signed)
Assumed care of pt upon being roomed, states he had kidney stones "plasted" and pain remains, did not pass stone(s). Ambulated independently from wheelchair to stretcher. Warm blanket provided.

## 2020-05-12 NOTE — Op Note (Signed)
Preoperative diagnosis:  Left ureteral calculi Left nephrolithiasis Renal colic  Postoperative diagnosis:  Same  Procedure:  Cystoscopy Left ureteroscopy and stone removal Ureteroscopic laser lithotripsy Left ureteral stent placement 6FR/26 cm Left retrograde pyelography with interpretation  Surgeon: Nicki Reaper C. Shardea Cwynar, M.D.  Anesthesia: General  Complications: None  Intraoperative findings:  1.  Left retrograde pyelography demonstrated a filling defect within the proximal ureter consistent with 1 of the patients known calculi.  Moderate left hydronephrosis.  2.  Left retrograde pyelography post procedure showed no filling defects, stone fragments or contrast extravasation  3.  Cystoscopy: Urethra normal caliber without stricture, mild lateral lobe enlargement with large occlusive median lobe; no bladder mucosal lesions including solid or papillary tumors; no efflux noted left ureteral orifice; small stone fragments noted in bladder  4.  Ureteroscopic findings: 2 fragments in distal ureter measuring 3 and 4 mm; 5 mm fragment mid ureter; 5 mm fragment left proximal ureter.  6 mm fragment in renal pelvis; 5 mm fragment lower pole calyx along with smaller <1 mm fragments lower pole calyx  EBL: Minimal  Specimens: Calculus fragments for analysis   Indication: CHARLESTON HANKIN is a 49 y.o. male status post shockwave lithotripsy of a 10 mm renal pelvic calculus on 05/07/2020.  He has required 2 ED visits for recurrent renal colic, nausea and vomiting the last visit this morning.  No fever, leukocytosis or pyuria.  CT 05/10/2020 showed fragments in the left ureter and collecting system.  After reviewing the management options for treatment, the patient elected to proceed with the above surgical procedure(s). We have discussed the potential benefits and risks of the procedure, side effects of the proposed treatment, the likelihood of the patient achieving the goals of the procedure, and any  potential problems that might occur during the procedure or recuperation. Informed consent has been obtained.  Description of procedure:  The patient was taken to the operating room and general anesthesia was induced.  The patient was placed in the dorsal lithotomy position, prepped and draped in the usual sterile fashion, and preoperative antibiotics were administered. A preoperative time-out was performed.   A 21 French cystoscope was lubricated and passed under direct vision and advanced in the bladder with findings as described above.   Attention was turned to the left ureteral orifice and a ureteral catheter was used to intubate the ureteral orifice.  Omnipaque contrast was injected through the ureteral catheter and a retrograde pyelogram was performed with findings as dictated above.  A 0.038 Sensor wire was then advanced up the left ureter into the renal pelvis under fluoroscopic guidance.  A 4.5 Fr semirigid ureteroscope was then advanced into the ureter next to the guidewire  A 3 mm fragment was identified the distal ureter and removed with a 1.9 [place] nitinol basket.  The ureteroscope was repassed and an approximately 4 mm fragment was identified in the upper portion of the distal ureter and was treated with a 200 m holmium laser fiber at a setting of 0.2 J / 20 Hz.  Once reduced to a smaller size this fragment was also removed with a 1.9 [place] nitinol basket.  The ureteroscope was repassed and a third fragment measuring approximately 5 mm was identified in the mid ureter and was reduced in size with the holmium laser   After repassing ureteroscope a fourth fragment was identified in the lower proximal ureter estimated at 5 mm and was treated in a similar fashion.  The ureteroscope was repassed just proximal to  the UPJ and no other fragments were identified.  A second Sensor wire was placed through the ureteroscope and the semirigid scope was removed.  A single channel digital  ureteroscope was then placed over the working wire and advanced into the distal ureter without difficulty.  Resistance was met in the proximal ureter which impeded passage of the flexible scope.  The ureteroscope was removed and a 12/14 French ureteral access sheath was placed over the working wire and advanced under fluoroscopic guidance without difficulty.  The flexible ureteroscope was then repassed and the 6 mm fragment was identified in the renal pelvis.  It was placed against the lower pole infundibulum and was dusted at a setting of 0.2 J / 20 Hz however migrated to the lower pole calyx where it was treated further.  Due to stone hardness several smaller fragments were produced which were removed with 1.9 [place] nitinol basket.   The second 5 mm fragment located in the lower pole calyx was difficult to visualized as it was partially obscured by the wall of the calyx.  It was flushed into the calyx placed in the basket and relocated to an upper pole calyx where it was dusted in similar fashion.  Smaller fragments that were produced were again removed with a 1.9 [place] nitinol basket.    The ureteroscope was then placed back into the lower pole calyx and settings were changed to 0.4 J / 40 Hz and remaining fragments were further reduced in size via a popcorn technique.  A few fragments estimated at >1 mm were removed with the basket.  Retrograde pyelogram was then performed and all calyces were examined.  3 additional lower calyceal fragments were removed.  The ureteroscope was then removed with the ureteral access sheath and no additional ureteral fragments or ureteral abnormalities were noted.  A 6FR/26 cm would not advance under fluoroscopic guidance.  The wire was then backloaded on the cystoscope however the stent would not advance and buckled.  The stent was removed and exchanged with a 5 [place] ureteral catheter and a second guidewire was placed in the event the first 1 was kinked however  the Contour stent would not advance.  A 6FR/26 cm Percuflex + stent was able to be advanced through the cystoscope without difficulty.  Good positioning was noted proximally under fluoroscopy and distally under direct vision.  All instruments were removed and after anesthetic reversal the patient was transported to the PACU in stable condition.  Plan: Office follow-up 7-10 days for stent removal.   John Giovanni, MD

## 2020-05-12 NOTE — Anesthesia Preprocedure Evaluation (Signed)
Anesthesia Evaluation  Patient identified by MRN, date of birth, ID band Patient awake    Reviewed: Allergy & Precautions, NPO status , Patient's Chart, lab work & pertinent test results  History of Anesthesia Complications Negative for: history of anesthetic complications  Airway Mallampati: III       Dental   Pulmonary neg sleep apnea, neg COPD, Not current smoker,           Cardiovascular hypertension, Pt. on medications (-) Past MI and (-) CHF (-) dysrhythmias (-) Valvular Problems/Murmurs     Neuro/Psych neg Seizures    GI/Hepatic Neg liver ROS, neg GERD  ,  Endo/Other  neg diabetes  Renal/GU Renal disease (stones)     Musculoskeletal   Abdominal   Peds  Hematology   Anesthesia Other Findings   Reproductive/Obstetrics                             Anesthesia Physical Anesthesia Plan  ASA: III  Anesthesia Plan: General   Post-op Pain Management:    Induction: Intravenous  PONV Risk Score and Plan: 2 and Ondansetron and Dexamethasone  Airway Management Planned: LMA and Oral ETT  Additional Equipment:   Intra-op Plan:   Post-operative Plan:   Informed Consent: I have reviewed the patients History and Physical, chart, labs and discussed the procedure including the risks, benefits and alternatives for the proposed anesthesia with the patient or authorized representative who has indicated his/her understanding and acceptance.       Plan Discussed with:   Anesthesia Plan Comments:         Anesthesia Quick Evaluation

## 2020-05-13 ENCOUNTER — Encounter: Payer: Self-pay | Admitting: Urology

## 2020-05-19 LAB — CALCULI, WITH PHOTOGRAPH (CLINICAL LAB)
Calcium Oxalate Dihydrate: 10 %
Calcium Oxalate Monohydrate: 90 %
Weight Calculi: 77 mg

## 2020-05-20 ENCOUNTER — Ambulatory Visit (INDEPENDENT_AMBULATORY_CARE_PROVIDER_SITE_OTHER): Payer: BC Managed Care – PPO | Admitting: Urology

## 2020-05-20 ENCOUNTER — Other Ambulatory Visit: Payer: Self-pay

## 2020-05-20 ENCOUNTER — Encounter: Payer: Self-pay | Admitting: Urology

## 2020-05-20 VITALS — BP 116/70 | HR 97 | Ht 71.0 in | Wt 393.0 lb

## 2020-05-20 DIAGNOSIS — N132 Hydronephrosis with renal and ureteral calculous obstruction: Secondary | ICD-10-CM

## 2020-05-20 DIAGNOSIS — N200 Calculus of kidney: Secondary | ICD-10-CM | POA: Diagnosis not present

## 2020-05-20 MED ORDER — CIPROFLOXACIN HCL 500 MG PO TABS
500.0000 mg | ORAL_TABLET | Freq: Once | ORAL | Status: AC
  Administered 2020-05-20: 500 mg via ORAL

## 2020-05-20 NOTE — Progress Notes (Signed)
Indications: Patient is 49 y.o., male status post ureteroscopic removal of left ureteral/renal stone fragments from prior ESWL on 05/12/2020.  The patient is presenting today for stent removal.  No significant postoperative problems.  He does complain of mild to moderate stent symptoms.  Denies fever or chills.  Has been taking Azo.  Procedure:  Flexible Cystoscopy with stent removal (35670)  Timeout was performed and the correct patient, procedure and participants were identified.    Description:  The patient was prepped and draped in the usual sterile fashion. Flexible cystosopy was performed.  The stent was visualized, grasped, and removed intact without difficulty. The patient tolerated the procedure well.  A single dose of oral antibiotics was given.  Complications:  None  Plan:   Instructed to call for fever or flank pain post stent removal  1 month follow-up with renal ultrasound and review of stone analysis   Irineo Axon, MD

## 2020-05-21 ENCOUNTER — Encounter: Payer: Self-pay | Admitting: Urology

## 2020-05-21 ENCOUNTER — Ambulatory Visit: Payer: BC Managed Care – PPO | Admitting: Physician Assistant

## 2020-05-21 LAB — URINALYSIS, COMPLETE
Bilirubin, UA: NEGATIVE
Glucose, UA: NEGATIVE
Ketones, UA: NEGATIVE
Nitrite, UA: POSITIVE — AB
Specific Gravity, UA: 1.02 (ref 1.005–1.030)
Urobilinogen, Ur: 0.2 mg/dL (ref 0.2–1.0)
pH, UA: 6.5 (ref 5.0–7.5)

## 2020-05-21 LAB — MICROSCOPIC EXAMINATION
RBC, Urine: >30 /hpf — AB (ref 0–2)
WBC, UA: >30 /hpf — AB (ref 0–5)

## 2020-05-28 LAB — CULTURE, URINE COMPREHENSIVE

## 2020-05-29 ENCOUNTER — Telehealth: Payer: Self-pay | Admitting: *Deleted

## 2020-05-29 NOTE — Telephone Encounter (Signed)
-----   Message from Riki Altes, MD sent at 05/29/2020  3:13 PM EDT ----- Urine culture grew a low level of bacteria most likely representing colonization. Please make sure he is not having any UTI symptoms

## 2020-05-29 NOTE — Telephone Encounter (Signed)
Notified patient as instructed, patient pleased. Discussed follow-up appointments, patient agrees. Patient to get his ultrasound scheduled . No uti symptoms.

## 2020-06-04 ENCOUNTER — Ambulatory Visit
Admission: RE | Admit: 2020-06-04 | Discharge: 2020-06-04 | Disposition: A | Discharge status: Home / Self Care | Payer: BC Managed Care – PPO | Source: Ambulatory Visit | Attending: Urology | Admitting: Urology

## 2020-06-04 ENCOUNTER — Other Ambulatory Visit: Payer: Self-pay

## 2020-06-04 DIAGNOSIS — N200 Calculus of kidney: Secondary | ICD-10-CM | POA: Insufficient documentation

## 2020-06-04 DIAGNOSIS — N132 Hydronephrosis with renal and ureteral calculous obstruction: Secondary | ICD-10-CM | POA: Insufficient documentation

## 2020-06-11 ENCOUNTER — Telehealth: Payer: Self-pay | Admitting: *Deleted

## 2020-06-11 NOTE — Telephone Encounter (Signed)
-----   Message from Riki Altes, MD sent at 06/11/2020 11:27 AM EDT ----- Renal ultrasound showed no abnormalities.  Keep scheduled follow-up appointment

## 2020-06-11 NOTE — Telephone Encounter (Signed)
Notified patient as instructed, patient pleased. Discussed follow-up appointments, patient agrees  

## 2020-06-19 ENCOUNTER — Ambulatory Visit: Payer: Self-pay | Admitting: Physician Assistant

## 2020-06-19 ENCOUNTER — Ambulatory Visit (INDEPENDENT_AMBULATORY_CARE_PROVIDER_SITE_OTHER): Payer: BC Managed Care – PPO | Admitting: Physician Assistant

## 2020-06-19 ENCOUNTER — Encounter: Payer: Self-pay | Admitting: Physician Assistant

## 2020-06-19 ENCOUNTER — Other Ambulatory Visit: Payer: Self-pay

## 2020-06-19 VITALS — BP 109/72 | HR 112 | Ht 71.0 in | Wt 393.0 lb

## 2020-06-19 DIAGNOSIS — N200 Calculus of kidney: Secondary | ICD-10-CM | POA: Diagnosis not present

## 2020-06-19 NOTE — Progress Notes (Signed)
06/19/2020 1:44 PM   Nathan Burch 07/27/1971 [id number]  CC: Chief Complaint  Patient presents with  . Results    HPI: Nathan Burch is a 49 y.o. male who underwent left URS/LL/stent placement with Dr. Lonna Cobb on 05/12/2020 for clearance of obstructing stone fragments following ESWL for management of a 9 mm left renal pelvic stone.  Stent removed in clinic on 05/20/2020.  Renal ultrasound dated 06/05/2020 with no significant findings.  Stone analysis with 90% calcium oxalate monohydrate and 10% calcium oxalate dihydrate.  He is not believed to have any residual stones at this time.  Today he reports some occasional persistent lower abdominal soreness without gross hematuria or dysuria.  He reports drinking plenty of his favorite soda, diet [facility].  He also enjoys [employer] and states he otherwise eats low oxalate vegetables consistent with a ketogenic diet.  He does struggle with added sodium.  In-office UA today positive for trace intact blood; urine microscopy pan negative.   PMH: Past Medical History:  Diagnosis Date  . Hypertension   . Renal calculi 05/07/2020    Surgical History: Past Surgical History:  Procedure Laterality Date  . CHOLECYSTECTOMY  06/02/2016   Procedure: LAPAROSCOPIC CHOLECYSTECTOMY;  Surgeon: Leafy Ro, MD;  Location: [facility] ORS;  Service: General;;  . Bluford Kaufmann W/ RETROGRADES Left 05/12/2020   Procedure: CYSTOSCOPY WITH RETROGRADE PYELOGRAM;  Surgeon: Riki Altes, MD;  Location: [facility] ORS;  Service: Urology;  Laterality: Left;  . CYSTOSCOPY/URETEROSCOPY/HOLMIUM LASER/STENT PLACEMENT Left 05/12/2020   Procedure: CYSTOSCOPY/URETEROSCOPY/HOLMIUM LASER/STENT PLACEMENT;  Surgeon: Riki Altes, MD;  Location: [facility] ORS;  Service: Urology;  Laterality: Left;  . EXTRACORPOREAL SHOCK WAVE LITHOTRIPSY Left 05/07/2020   Procedure: EXTRACORPOREAL SHOCK WAVE LITHOTRIPSY (ESWL);  Surgeon: Vanna Scotland, MD;  Location: [facility] ORS;  Service:  Urology;  Laterality: Left;  . NO PAST SURGERIES      Home Medications:  Allergies as of 06/19/2020   No Known Allergies     Medication List       Accurate as of June 19, 2020  1:44 PM. If you have any questions, ask your nurse or doctor.        atorvastatin 20 MG tablet Commonly known as: LIPITOR Take 20 mg by mouth daily.   HYDROcodone-acetaminophen 5-325 MG tablet Commonly known as: NORCO/VICODIN Take 1-2 tablets by mouth every 6 (six) hours as needed for moderate pain.   meloxicam 15 MG tablet Commonly known as: MOBIC Take 15 mg by mouth daily as needed.   Olmesartan-amLODIPine-HCTZ 40-10-25 MG Tabs Take 1 tablet by mouth every morning.   oxybutynin 5 MG tablet Commonly known as: DITROPAN 1 tab tid prn frequency,urgency, bladder spasm   tamsulosin 0.4 MG Caps capsule Commonly known as: Flomax Take 1 capsule (0.4 mg total) by mouth daily.       Allergies:  No Known Allergies  Family History: Family History  Problem Relation Age of Onset  . Heart disease Mother   . Diabetes Mother   . Hypertension Mother   . Heart disease Father   . Hypertension Father     Social History:   reports that he has never smoked. He has never used smokeless tobacco. He reports that he does not drink alcohol and does not use drugs.  Physical Exam: BP 109/72 (BP Location: Left Arm, Patient Position: Sitting, Cuff Size: Large)   Pulse (!) 112   Ht 5\' 11"  (1.803 m)   Wt (!) 393 lb (178.3 kg)   BMI 54.81  kg/m   Constitutional:  Alert and oriented, no acute distress, nontoxic appearing HEENT: [place], AT Cardiovascular: No clubbing, cyanosis, or edema Respiratory: Normal respiratory effort, no increased work of breathing Skin: No rashes, bruises or suspicious lesions Neurologic: Grossly intact, no focal deficits, moving all 4 extremities Psychiatric: Normal mood and affect  Laboratory Data: Results for orders placed or performed in visit on 06/19/20  Microscopic  Examination   Urine  Result Value Ref Range   WBC, UA 0-5 0 - 5 /hpf   RBC 0-2 0 - 2 /hpf   Epithelial Cells (non renal) None seen 0 - 10 /hpf   Bacteria, UA None seen None seen/Few  Urinalysis, Complete  Result Value Ref Range   Specific Gravity, UA >1.030 (H) 1.005 - 1.030   pH, UA 6.0 5.0 - 7.5   Color, UA Yellow Yellow   Appearance Ur Clear Clear   Leukocytes,UA Negative Negative   Protein,UA Negative Negative/Trace   Glucose, UA Negative Negative   Ketones, UA Negative Negative   RBC, UA Trace (A) Negative   Bilirubin, UA Negative Negative   Urobilinogen, Ur 0.2 0.2 - 1.0 mg/dL   Nitrite, UA Negative Negative   Microscopic Examination See below:    Pertinent Imaging: Results for orders placed during the hospital encounter of 06/04/20  Ultrasound renal complete  Narrative CLINICAL DATA:  Nephrolithiasis, recent hydronephrosis related to obstructing stones in the left ureter 05/10/2020  EXAM: RENAL / URINARY TRACT ULTRASOUND COMPLETE  COMPARISON:  05/10/2020 CT urogram  FINDINGS: Right Kidney:  Renal measurements: 11.7 x 4.9 x 5.0 cm = volume: 150 mL. Echogenicity within normal limits. No mass or hydronephrosis visualized.  Left Kidney:  Renal measurements: 12.5 x 5.6 x 5.4 cm = volume: 196 mL. Echogenicity within normal limits. No mass or hydronephrosis visualized.  Bladder:  Appears normal for degree of bladder distention.  Other:  No free fluid.  IMPRESSION: Normal renal ultrasound.  Negative for hydronephrosis.   Electronically Signed By: Judie Petit.  Shick M.D. On: 06/05/2020 16:39  I personally reviewed the images referenced above and note no significant findings.  Assessment & Plan:   1. Nephrolithiasis Successful clearance of fragments of the previously 9 mm left renal pelvic stone, calcium oxalate on stone analysis.  I counseled the patient on general stone prevention techniques including increasing fluid intake, decreasing intake of high  oxalate-containing foods, increasing citric acid intake, maintaining moderate dietary calcium intake, and decreasing salt intake.  Counseled patient to switch to clear sodas, avoid added salt, and pair high oxalate foods with dairy to bind oxalate and reduce stone formation. Written and verbal guidance provided today. - Urinalysis, Complete  Return if symptoms worsen or fail to improve.  Carman Ching, PA-C  [facility] [address] [place], [place] [address] [phone]

## 2020-06-20 LAB — URINALYSIS, COMPLETE
Bilirubin, UA: NEGATIVE
Glucose, UA: NEGATIVE
Ketones, UA: NEGATIVE
Leukocytes,UA: NEGATIVE
Nitrite, UA: NEGATIVE
Protein,UA: NEGATIVE
Specific Gravity, UA: >1.03 — ABNORMAL HIGH (ref 1.005–1.030)
Urobilinogen, Ur: 0.2 mg/dL (ref 0.2–1.0)
pH, UA: 6 (ref 5.0–7.5)

## 2020-06-20 LAB — MICROSCOPIC EXAMINATION
Bacteria, UA: NONE SEEN
Epithelial Cells (non renal): NONE SEEN /hpf (ref 0–10)

## 2020-11-16 ENCOUNTER — Telehealth: Payer: Self-pay

## 2020-11-16 NOTE — Telephone Encounter (Signed)
Pt plans on checking with his insurance, and possibly scheduling in June.  He will call back to let us know what he decides.  thanks-Yesly Gerety, CMA

## 2020-11-17 ENCOUNTER — Telehealth: Payer: BC Managed Care – PPO

## 2020-12-29 IMAGING — CT CT RENAL STONE PROTOCOL
2 of 4 series · 16 of 46 positions shown, 18 images · non-contrast
Comparison: CT abdomen dated 04/25/2020.

CLINICAL DATA: Flank pain.

EXAM:
CT ABDOMEN AND PELVIS WITHOUT CONTRAST
TECHNIQUE: Multidetector CT imaging of the abdomen and pelvis was performed
following the standard protocol without IV contrast.

[Series 2: stone full standard · axial · 0.98mm/px · z∈[-1036,-546]mm · 13 of 108 slices shown, 15 images]
[im 5/108  soft-tissue]
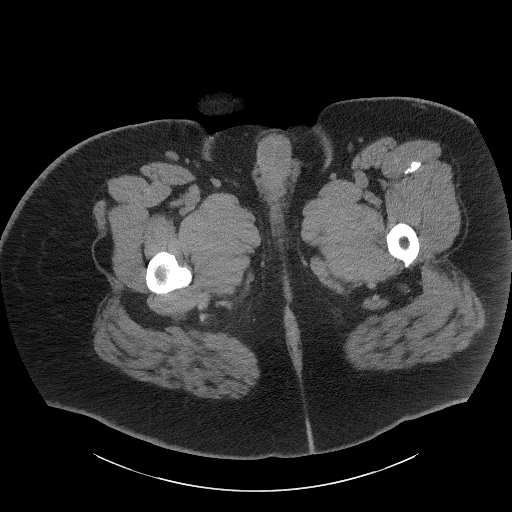
[im 5/108  bone]
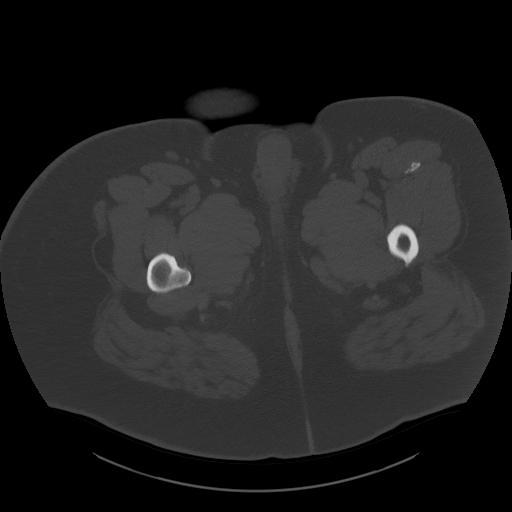
[im 14/108  soft-tissue]
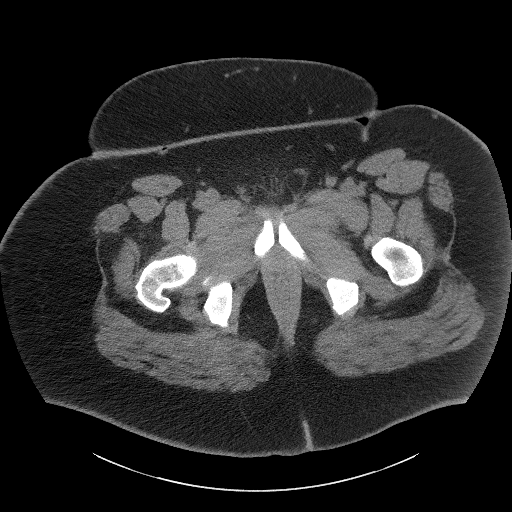
[im 24/108  soft-tissue]
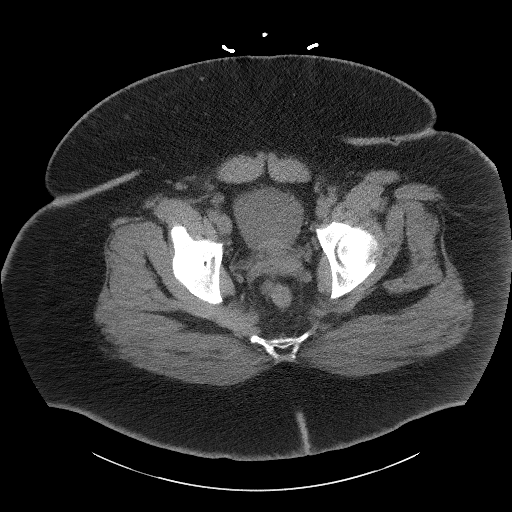
[im 28/108  soft-tissue]
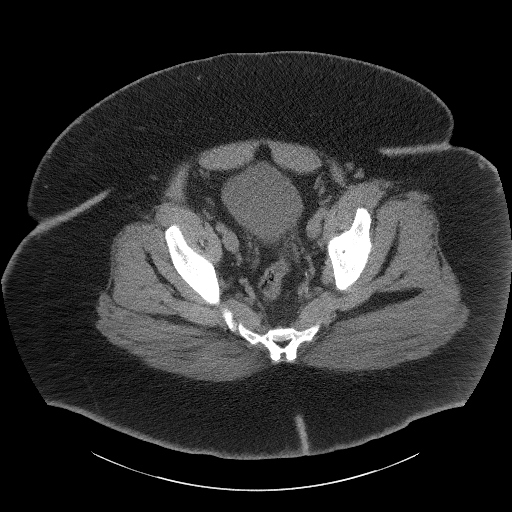
[im 38/108  soft-tissue]
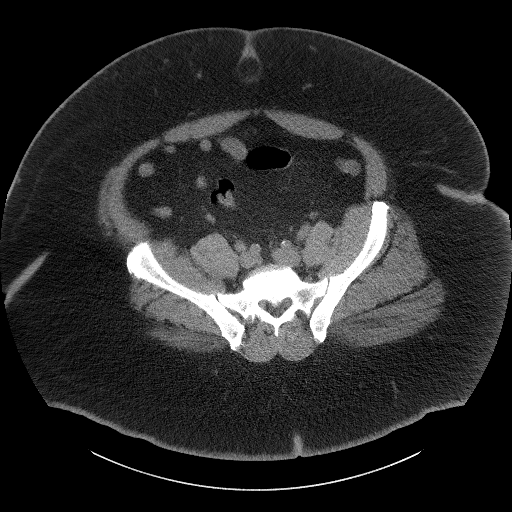
[im 47/108  soft-tissue]
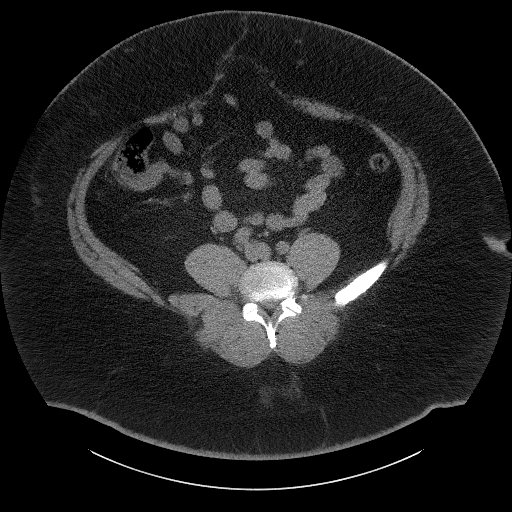
[im 56/108  soft-tissue]
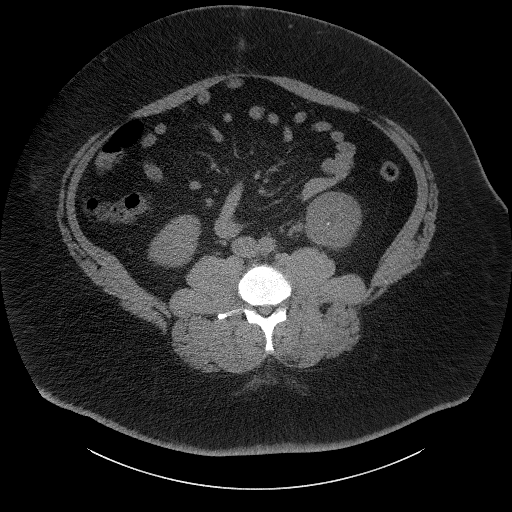
[im 61/108  soft-tissue]
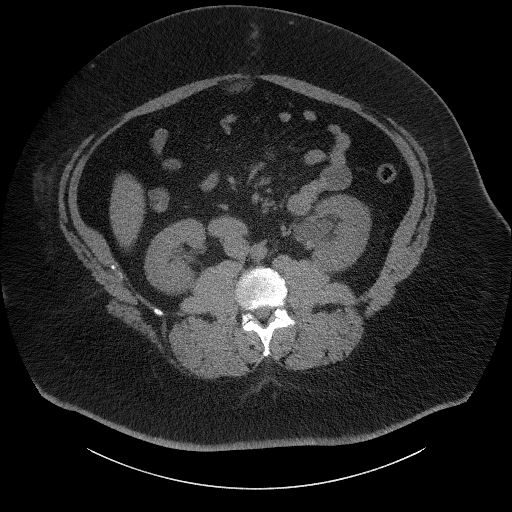
[im 70/108  soft-tissue]
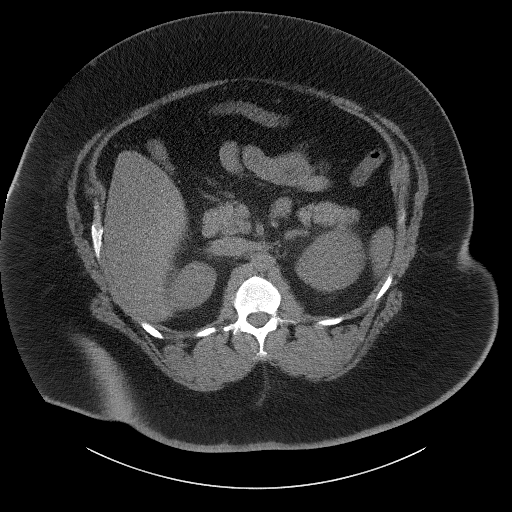
[im 70/108  bone]
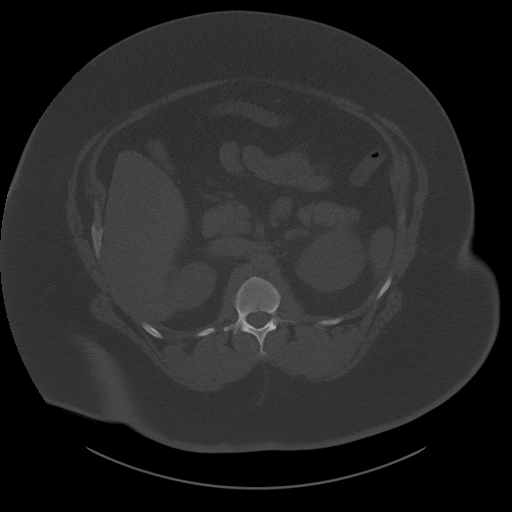
[im 80/108  soft-tissue]
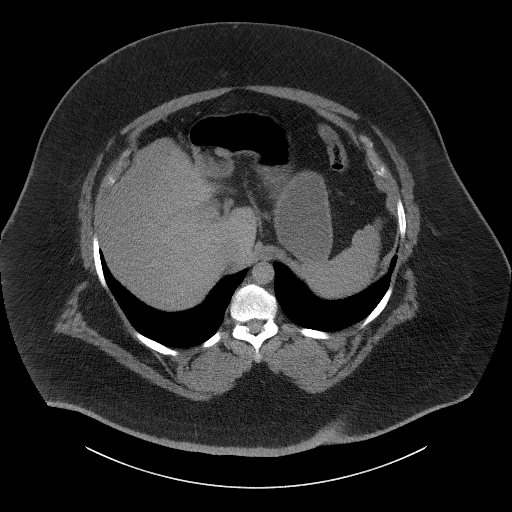
[im 84/108  soft-tissue]
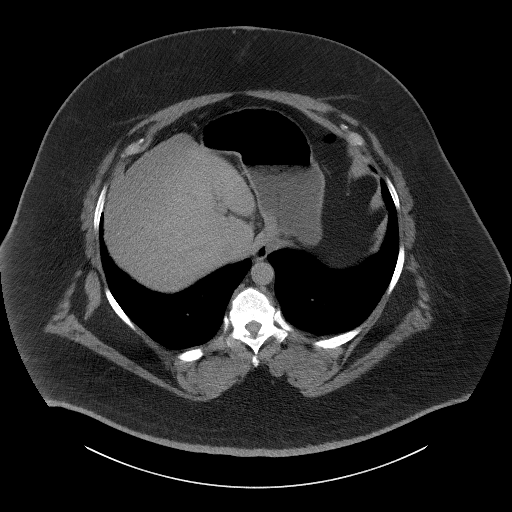
[im 94/108  soft-tissue]
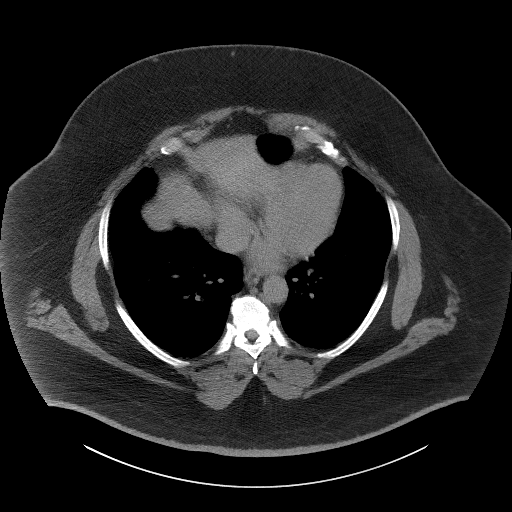
[im 103/108  soft-tissue]
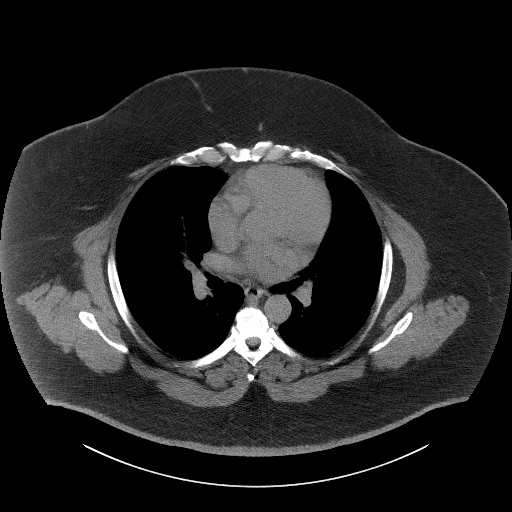

[Series 5: coronal · coronal · 1.06mm/px · 3 of 226 slices shown]
[im 76/226  soft-tissue]
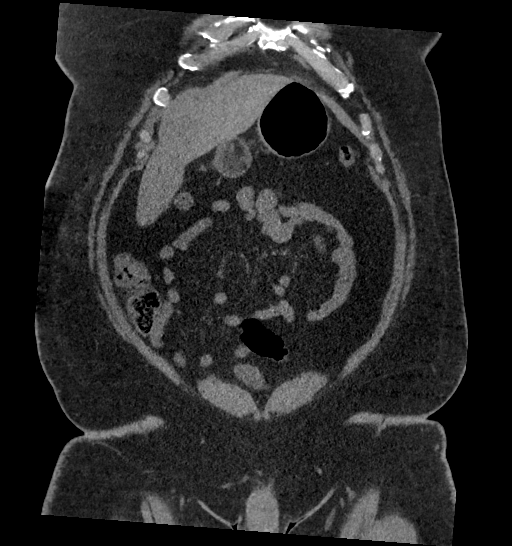
[im 101/226  soft-tissue]
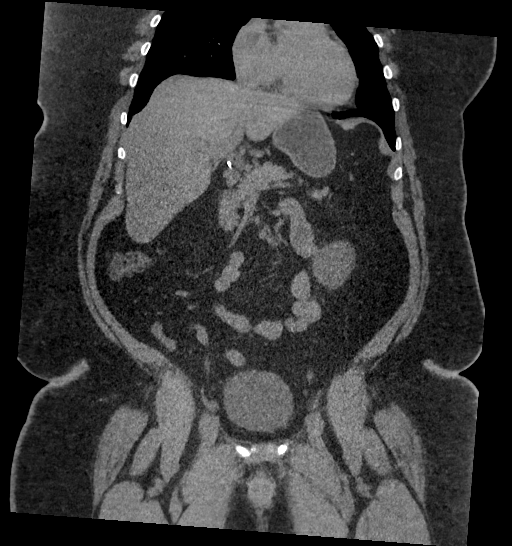
[im 126/226  soft-tissue]
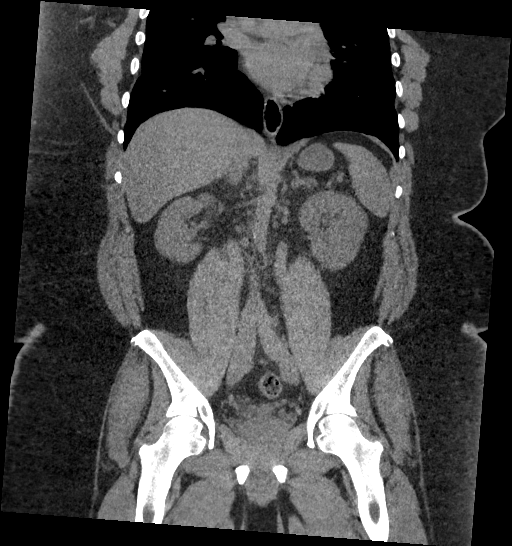

[16 of 46 positions shown; findings below may reference images not displayed]

FINDINGS: Lower chest: 3 mm pulmonary nodule within the RIGHT lower lobe. Lung
bases otherwise clear.

Hepatobiliary: No focal liver abnormality is seen. Status post
cholecystectomy. No biliary dilatation.

Pancreas: Unremarkable. No pancreatic ductal dilatation or
surrounding inflammatory changes.

Spleen: Normal in size without focal abnormality.

Adrenals/Urinary Tract: 3 separate stones within the proximal LEFT
ureter, largest measuring 4 mm. Moderate LEFT-sided hydronephrosis.

Additional 6 mm stone within the LEFT renal pelvis. Additional 1 mm
and 5 mm stones within the LEFT renal pelvis.

RIGHT kidney is unremarkable without stone or hydronephrosis.
Bladder is unremarkable, partially decompressed.

Stomach/Bowel: No dilated large or small bowel loops. No evidence of
bowel wall inflammation.

Vascular/Lymphatic: No significant vascular findings are present. No
enlarged abdominal or pelvic lymph nodes.

Reproductive: Prostate is unremarkable.

Other: No abscess collection.  No free intraperitoneal air.

Musculoskeletal: No acute or suspicious osseous finding. Midline
abdominal wall diastasis recti and small fat containing umbilical
hernia.
IMPRESSION: 1. 3 separate stones within the proximal LEFT ureter, largest
measuring 4 mm, causing moderate LEFT-sided hydronephrosis.
2. Additional LEFT nephrolithiasis, as detailed above.
3. 3 mm pulmonary nodule within the RIGHT lower lobe. No follow-up
needed if patient is low-risk. Non-contrast chest CT can be
considered in 12 months if patient is high-risk. This recommendation
follows the consensus statement: Guidelines for Management of
Incidental Pulmonary Nodules Detected on CT Images: From the
4. Midline abdominal wall diastasis recti and small fat containing
umbilical hernia.

## 2020-12-31 IMAGING — CR DG ABDOMEN 1V
2 series · 2 of 2 positions shown · non-contrast
Comparison: CT 05/10/2020

CLINICAL DATA: Constipation

EXAM:
ABDOMEN - 1 VIEW

[abdomen kub (1 of 2)]
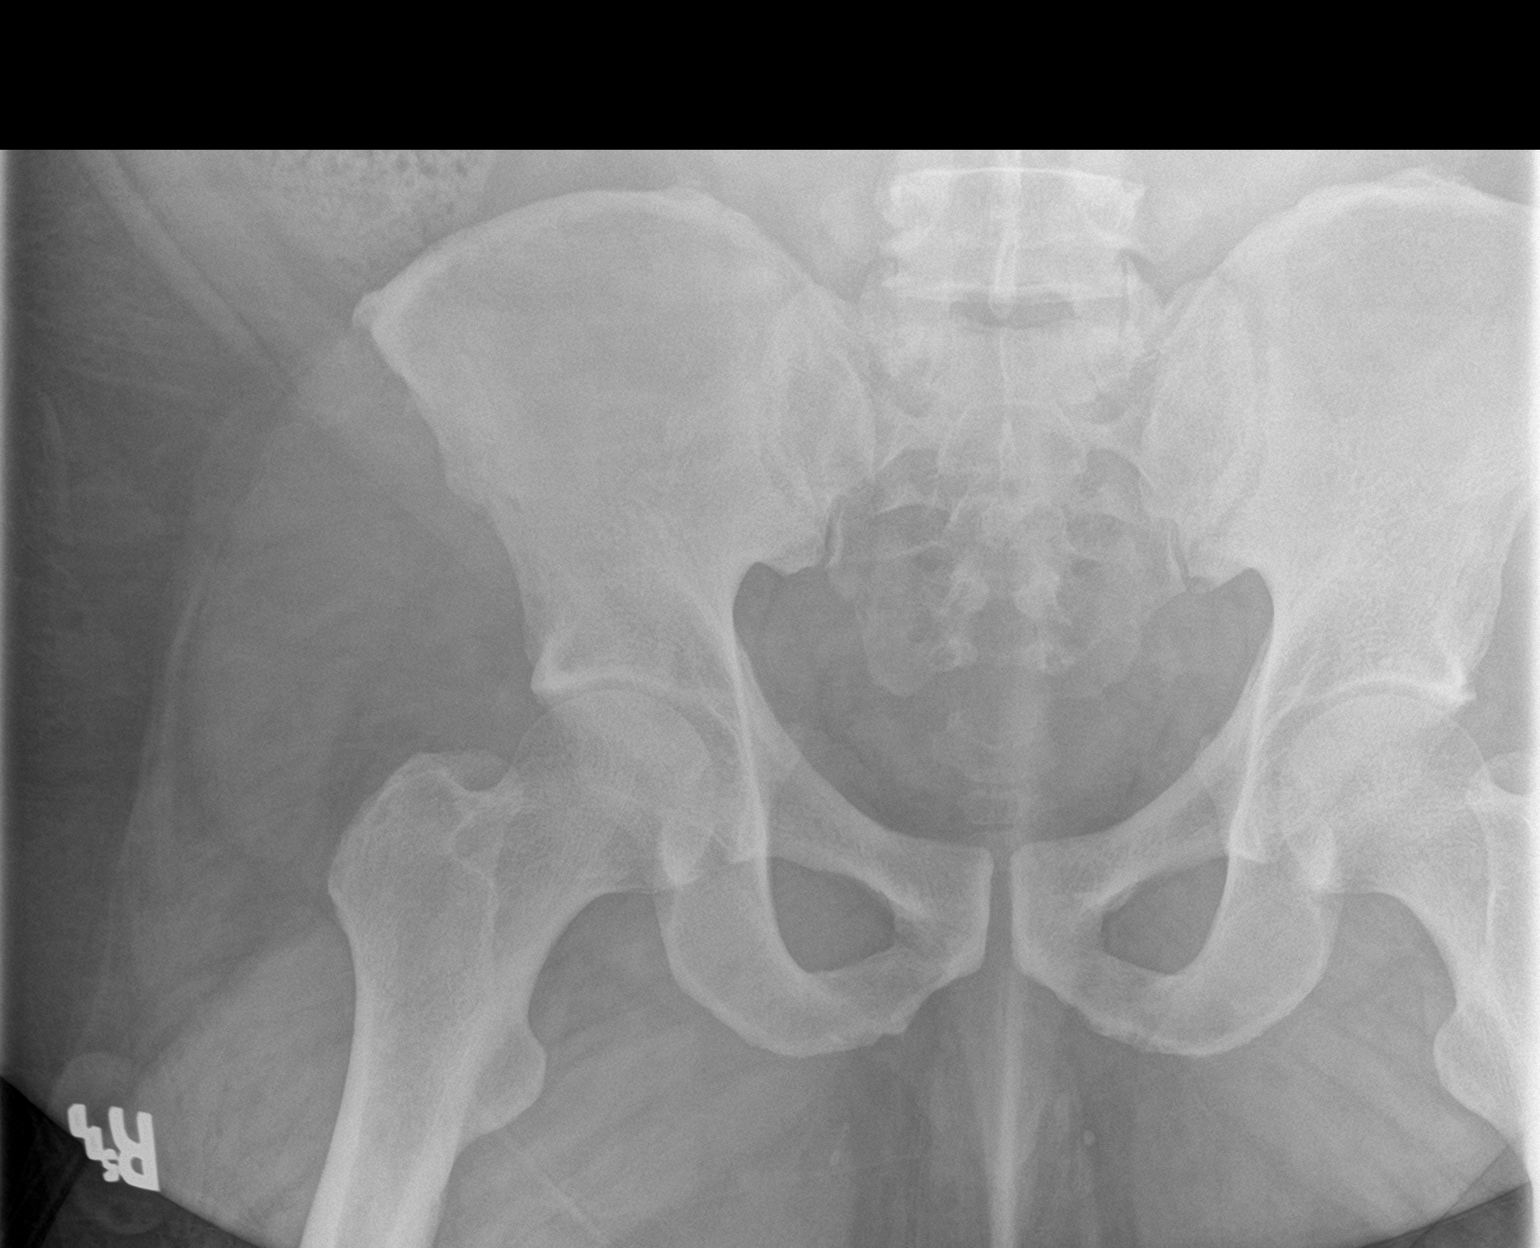

[abdomen kub (2 of 2)]
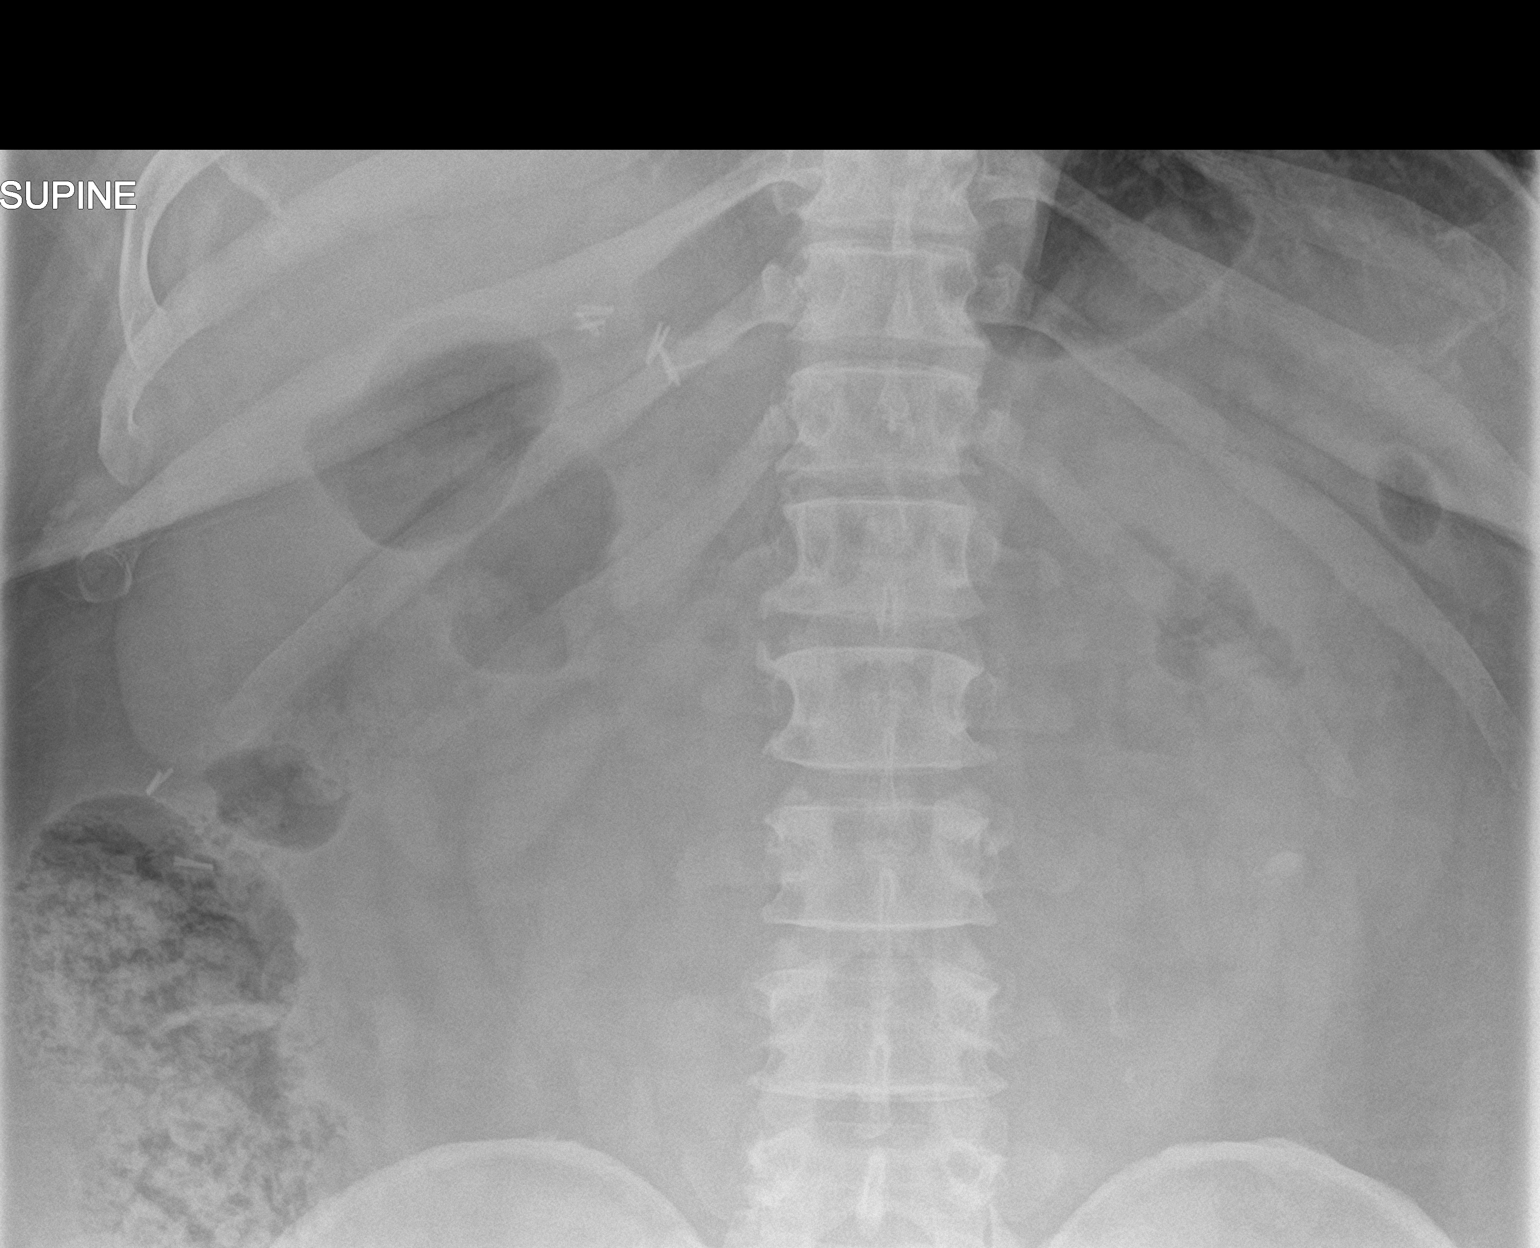

[2 of 2 positions shown; findings below may reference images not displayed]

FINDINGS: Left lower pole stone. Calcifications again seen in the left lower
abdomen, likely multiple proximal to mid left ureteral stones as
seen on prior CT. Nonobstructive bowel gas pattern. No organomegaly.
Prior cholecystectomy.
IMPRESSION: Left lower pole nephrolithiasis.

Probable multiple proximal to mid left ureteral stones as seen on
prior CT.

## 2021-11-29 ENCOUNTER — Emergency Department (HOSPITAL_COMMUNITY)
Admission: EM | Admit: 2021-11-29 | Discharge: 2021-11-29 | Disposition: A | Discharge status: Home / Self Care | Payer: BC Managed Care – PPO | Attending: Emergency Medicine | Admitting: Emergency Medicine

## 2021-11-29 DIAGNOSIS — R509 Fever, unspecified: Secondary | ICD-10-CM | POA: Insufficient documentation

## 2021-11-29 DIAGNOSIS — Z5321 Procedure and treatment not carried out due to patient leaving prior to being seen by health care provider: Secondary | ICD-10-CM | POA: Diagnosis not present

## 2021-11-29 NOTE — ED Notes (Signed)
Pt asked staff to recheck his temperature, since it was lower than what it was when he first came in, he decided to leave.  ?

## 2023-03-16 ENCOUNTER — Ambulatory Visit: Payer: Self-pay | Admitting: Nurse Practitioner

## 2024-07-17 ENCOUNTER — Other Ambulatory Visit: Payer: Self-pay | Admitting: Nurse Practitioner

## 2024-07-17 ENCOUNTER — Ambulatory Visit
Admission: RE | Admit: 2024-07-17 | Discharge: 2024-07-17 | Disposition: A | Discharge status: Home / Self Care | Payer: Worker's Compensation | Source: Ambulatory Visit | Attending: Nurse Practitioner | Admitting: Nurse Practitioner

## 2024-07-17 DIAGNOSIS — M549 Dorsalgia, unspecified: Secondary | ICD-10-CM

## 2024-07-17 DIAGNOSIS — M545 Low back pain, unspecified: Secondary | ICD-10-CM
